# Patient Record
Sex: Male | Born: 1990 | Race: White | Hispanic: No | Marital: Married | Smoking: Former smoker
Health system: Southern US, Community
[De-identification: ages and names within clinical notes are randomized; demographics above are authoritative.]

## PROBLEM LIST (undated history)

## (undated) DIAGNOSIS — Z8619 Personal history of other infectious and parasitic diseases: Secondary | ICD-10-CM

## (undated) DIAGNOSIS — T7840XA Allergy, unspecified, initial encounter: Secondary | ICD-10-CM

## (undated) DIAGNOSIS — L309 Dermatitis, unspecified: Secondary | ICD-10-CM

## (undated) DIAGNOSIS — F329 Major depressive disorder, single episode, unspecified: Secondary | ICD-10-CM

## (undated) DIAGNOSIS — F32A Depression, unspecified: Secondary | ICD-10-CM

## (undated) DIAGNOSIS — F1910 Other psychoactive substance abuse, uncomplicated: Secondary | ICD-10-CM

## (undated) DIAGNOSIS — F988 Other specified behavioral and emotional disorders with onset usually occurring in childhood and adolescence: Secondary | ICD-10-CM

## (undated) DIAGNOSIS — G43909 Migraine, unspecified, not intractable, without status migrainosus: Secondary | ICD-10-CM

## (undated) HISTORY — DX: Major depressive disorder, single episode, unspecified: F32.9

## (undated) HISTORY — DX: Other psychoactive substance abuse, uncomplicated: F19.10

## (undated) HISTORY — DX: Other specified behavioral and emotional disorders with onset usually occurring in childhood and adolescence: F98.8

## (undated) HISTORY — PX: HERNIA REPAIR: SHX51

## (undated) HISTORY — DX: Personal history of other infectious and parasitic diseases: Z86.19

## (undated) HISTORY — DX: Depression, unspecified: F32.A

## (undated) HISTORY — DX: Allergy, unspecified, initial encounter: T78.40XA

## (undated) HISTORY — DX: Migraine, unspecified, not intractable, without status migrainosus: G43.909

## (undated) HISTORY — PX: TONSILLECTOMY AND ADENOIDECTOMY: SUR1326

## (undated) HISTORY — DX: Dermatitis, unspecified: L30.9

---
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
## 2010-09-16 ENCOUNTER — Ambulatory Visit: Payer: Self-pay | Admitting: Family Medicine

## 2010-09-16 DIAGNOSIS — G43909 Migraine, unspecified, not intractable, without status migrainosus: Secondary | ICD-10-CM | POA: Insufficient documentation

## 2010-09-16 DIAGNOSIS — F329 Major depressive disorder, single episode, unspecified: Secondary | ICD-10-CM

## 2010-09-24 ENCOUNTER — Encounter: Payer: Self-pay | Admitting: Family Medicine

## 2010-09-24 LAB — CONVERTED CEMR LAB: Osmolality, Ur: 490 mOsm/kg (ref 390–1090)

## 2010-09-27 LAB — CONVERTED CEMR LAB
BUN: 12 mg/dL (ref 6–23)
Basophils Absolute: 0 10*3/uL (ref 0.0–0.1)
Basophils Relative: 0.4 % (ref 0.0–3.0)
CO2: 25 meq/L (ref 19–32)
Calcium: 9.2 mg/dL (ref 8.4–10.5)
Chloride: 106 meq/L (ref 96–112)
Creatinine, Ser: 1.1 mg/dL (ref 0.4–1.5)
Eosinophils Absolute: 0.1 10*3/uL (ref 0.0–0.7)
Eosinophils Relative: 0.4 % (ref 0.0–5.0)
GFR calc non Af Amer: 94.55 mL/min (ref >60)
Glucose, Bld: 93 mg/dL (ref 70–99)
HCT: 45.5 % (ref 39.0–52.0)
Hemoglobin: 15.7 g/dL (ref 13.0–17.0)
Lymphocytes Relative: 33 % (ref 12.0–46.0)
Lymphs Abs: 4.3 10*3/uL — ABNORMAL HIGH (ref 0.7–4.0)
MCHC: 34.4 g/dL (ref 30.0–36.0)
MCV: 90.6 fL (ref 78.0–100.0)
Monocytes Absolute: 1 10*3/uL (ref 0.1–1.0)
Monocytes Relative: 7.6 % (ref 3.0–12.0)
Neutro Abs: 7.7 10*3/uL (ref 1.4–7.7)
Neutrophils Relative %: 58.6 % (ref 43.0–77.0)
Platelets: 254 10*3/uL (ref 150.0–400.0)
Potassium: 3.5 meq/L (ref 3.5–5.1)
RBC: 5.02 M/uL (ref 4.22–5.81)
RDW: 12.5 % (ref 11.5–14.6)
Sodium: 141 meq/L (ref 135–145)
WBC: 13.1 10*3/uL — ABNORMAL HIGH (ref 4.5–10.5)

## 2010-10-07 ENCOUNTER — Ambulatory Visit: Payer: Self-pay | Admitting: Family Medicine

## 2010-10-07 DIAGNOSIS — F988 Other specified behavioral and emotional disorders with onset usually occurring in childhood and adolescence: Secondary | ICD-10-CM | POA: Insufficient documentation

## 2011-01-06 NOTE — Assessment & Plan Note (Signed)
Summary: NEW PT TO EST/CPX/CLE   Vital Signs:  Patient profile:   20 year old male Height:      73 inches Weight:      251.0 pounds BMI:     33.24 Temp:     99.0 degrees F oral Pulse rate:   72 / minute Pulse rhythm:   regular BP sitting:   120 / 82  (left arm) Cuff size:   large  Vitals Entered By: Benny Lennert CMA Duncan Dull) (September 16, 2010 1:57 PM)  History of Present Illness: Chief complaint new patient to be established   20 year old male:  Polyuria: constant urination, has always been really thirsty. drinks a lot. drinks three or four glasses of water at a meal.  He has also had a problem with enuresis off and on, mostly as a child, but will still have occaisionally. D/w pediatrician, had some counselling in the past. will get up around two or three times a night. Very infrequently, but not with alcohol.  Infrequent and seems to be random.   Preventive Screening-Counseling & Management  Alcohol-Tobacco     Alcohol drinks/day: <1     Alcohol Counseling: not indicated; use of alcohol is not excessive or problematic     Smoking Status: quit < 6 months     Smoking Cessation Counseling: yes     Smoke Cessation Stage: quit     Tobacco Counseling: to remain off tobacco products  Caffeine-Diet-Exercise     Diet Counseling: to improve diet; diet is suboptimal     Does Patient Exercise: yes     Exercise (avg: min/session): 30-60     Times/week: 4     Exercise Counseling: to improve exercise regimen  Hep-HIV-STD-Contraception     HIV Risk: no risk noted     STD Risk: no risk noted     Testicular SE Education/Counseling to perform regular STE      Sexual History:  currently monogamous.        Drug Use:  smokes some.    Allergies (verified): No Known Drug Allergies  Past History:  Past Medical History: Current Problems:  MIGRAINE HEADACHE (ICD-346.90) DEPRESSION (ICD-311)    Past Surgical History: Double hernia repair at age 36 (17)  tonsils/adenoids  63  Family History: Family History Diabetes 1st degree relative Family History High cholesterol Family History Hypertension Family History Lung cancer Family History of Prostate CA 1st degree relative <50 Family History of Stroke M 1st degree relative <50 Family History of Cardiovascular disorder  Social History: Consulting civil engineer Single Former smoker, quit 2011 some green Regular exercise-yes Smoking Status:  quit < 6 months Drug Use:  smokes some Does Patient Exercise:  yes HIV Risk:  no risk noted STD Risk:  no risk noted Sexual History:  currently monogamous  Review of Systems  General: Denies fever, chills, sweats, and anorexia. Eyes: Denies blurring. ENT: Denies earache, ear discharge, decreased hearing, nasal congestion, and sore throat. CV: Denies chest pains, dyspnea on exertion, palpitations, and syncope. Resp: Denies cough, cough with exercise, dyspnea at rest, excessive sputum, nighttime cough or wheeze, and wheezing GI: Denies nausea, vomiting, diarrhea, constipation, change in bowel habits, abdominal pain, melena, BRBPR  GU: CONCERNS OVER INCREASED URINATION AT NIGHT, DURING THE DAY, SOME BED WETTING MS: no back pain, joint pain, stiffness, and arthritis. Derm: No rash, itching, and dryness Neuro: No abnormal gait, frequent headaches, paresthesias, seizures, vertigo, and weakness Psych: No anxiety, behavioral problems, compulsive behavior, depression, hyperactivity, and inattentive. Endo: No  polydipsia, polyphagia, polyuria, and unusual weight change Heme: No bruising or LAD Allergy: No urticaria or hayfever    Physical Exam  General:  Well-developed,well-nourished,in no acute distress; alert,appropriate and cooperative throughout examination Head:  Normocephalic and atraumatic without obvious abnormalities. No apparent alopecia or balding. Eyes:  pupils equal, pupils round, and pupils reactive to light.   Ears:  External ear exam shows no significant lesions or  deformities.  Otoscopic examination reveals clear canals, tympanic membranes are intact bilaterally without bulging, retraction, inflammation or discharge. Hearing is grossly normal bilaterally. Nose:  External nasal examination shows no deformity or inflammation. Nasal mucosa are pink and moist without lesions or exudates. Mouth:  Oral mucosa and oropharynx without lesions or exudates.  Teeth in good repair. Neck:  No deformities, masses, or tenderness noted. Chest Wall:  No deformities, masses, tenderness or gynecomastia noted. Lungs:  Normal respiratory effort, chest expands symmetrically. Lungs are clear to auscultation, no crackles or wheezes. Heart:  Normal rate and regular rhythm. S1 and S2 normal without gallop, murmur, click, rub or other extra sounds. Abdomen:  Bowel sounds positive,abdomen soft and non-tender without masses, organomegaly or hernias noted. Genitalia:  Testes bilaterally descended without nodularity, tenderness or masses. No scrotal masses or lesions. No penis lesions or urethral discharge. Msk:  normal ROM.   Extremities:  No clubbing, cyanosis, edema, or deformity noted with normal full range of motion of all joints.   Neurologic:  alert & oriented X3 and gait normal.   Skin:  Intact without suspicious lesions or rashes Cervical Nodes:  No lymphadenopathy noted Psych:  Cognition and judgment appear intact. Alert and cooperative with normal attention span and concentration. No apparent delusions, illusions, hallucinations   Impression & Recommendations:  Problem # 1:  HEALTH MAINTENANCE EXAM (ICD-V70.0) The patient's preventative maintenance and recommended screening tests for an annual wellness exam were reviewed in full today. Brought up to date unless services declined.  Counselled on the importance of diet, exercise, and its role in overall health and mortality. The patient's FH and SH was reviewed, including their home life, tobacco status, and drug and alcohol  status.   doing great, check basic labs, update Tdap, flu, o/w vaccines up to date doing well in college  Problem # 2:  POLYURIA (ZOX-096.04) Assessment: New  The patient raises the question about polyuria and enuresis - longstanding. Asks about diabetes insipidous - found an article on this. We discuss this and diabetes mellitus, which would be higher in my differential.  Check blood sugar and a random urine osmolality. If this is abnormal, then checking 24 hour urine studies with urine osmolality would be more accurate and should be done.   Orders: Venipuncture (54098) TLB-BMP (Basic Metabolic Panel-BMET) (80048-METABOL) T- * Misc. Laboratory test [id number])  Problem # 3:  ENURESIS (ICD-307.6) Assessment: New  Other Orders: TLB-CBC Platelet - w/Differential (85025-CBCD) Admin 1st Vaccine (78295) Flu Vaccine 61yrs + (62130) Tdap => 32yrs IM (86578) Admin of Any Addtl Vaccine (46962)  Prior Medications (reviewed today): None Current Allergies (reviewed today): No known allergies                     Flu Vaccine Consent Questions     Do you have a history of severe allergic reactions to this vaccine? no    Any prior history of allergic reactions to egg and/or gelatin? no    Do you have a sensitivity to the preservative Thimersol? no    Do you have a past  history of Guillan-Barre Syndrome? no    Do you currently have an acute febrile illness? no    Have you ever had a severe reaction to latex? no    Vaccine information given and explained to patient? yes    Are you currently pregnant? no    Lot Number:[id number]   Exp Date:06/04/2011   Site Given  Left Deltoid IMbflu   Immunizations Administered:  Tetanus Vaccine:    Vaccine Type: Tdap    Site: right deltoid    Mfr: GlaxoSmithKline    Dose: 0.5 ml    Route: IM    Given by: Benny Lennert CMA (AAMA)    Exp. Date: 09/23/2012    Lot #: [id number]    VIS given: 10/22/08 version given September 16, 2010.   Appended Document: NEW PT TO EST/CPX/CLE

## 2011-01-06 NOTE — Letter (Signed)
Summary: Records from [facility] 2006 - 2010  Records from [facility] 2006 - 2010   Imported By: Maryln Gottron 09/24/2010 12:41:24  _____________________________________________________________________  External Attachment:    Type:   Image     Comment:   External Document

## 2011-01-06 NOTE — Miscellaneous (Signed)
Summary: [employer] [phone]  [facility] 5409 - 2008   Imported By: Maryln Gottron 09/24/2010 12:31:09  _____________________________________________________________________  External Attachment:    Type:   Image     Comment:   External Document

## 2011-01-06 NOTE — Miscellaneous (Signed)
Summary: Controlled Substance Agreement  Controlled Substance Agreement   Imported By: Lanelle Bal 10/15/2010 10:15:35  _____________________________________________________________________  External Attachment:    Type:   Image     Comment:   External Document

## 2011-01-06 NOTE — Assessment & Plan Note (Signed)
Summary: DISCUSS ADD MEDS / LFW   Vital Signs:  Patient profile:   20 year old male Height:      73 inches Weight:      252.0 pounds BMI:     33.37 Temp:     99.0 degrees F oral Pulse rate:   72 / minute Pulse rhythm:   regular  Vitals Entered By: Benny Lennert CMA Duncan Dull) (October 07, 2010 12:29 PM)  History of Present Illness: Chief complaint Discuss ADD medication  20 year old male:  Having a hard time in school  Spacing out some in class. Difficulty concentratigng and getting things done.  Very difficulty studying. Tyrying to break up into intervals.   Failing one class.  D in the other  Not going out to much Minimal ETOH, nothing else    Allergies (verified): No Known Drug Allergies  Past History:  Past medical, surgical, family and social histories (including risk factors) reviewed, and no changes noted (except as noted below).  Past Medical History: MIGRAINE HEADACHE (ICD-346.90) DEPRESSION (ICD-311) ADD    Past Surgical History: Reviewed history from 09/16/2010 and no changes required. Double hernia repair at age 29 (72)  tonsils/adenoids 24  Family History: Reviewed history from 09/16/2010 and no changes required. Family History Diabetes 1st degree relative Family History High cholesterol Family History Hypertension Family History Lung cancer Family History of Prostate CA 1st degree relative <50 Family History of Stroke M 1st degree relative <50 Family History of Cardiovascular disorder  Social History: Reviewed history from 09/16/2010 and no changes required. Student Single Former smoker, quit 2011 some green Regular exercise-yes  Review of Systems Psych:  Denies anxiety, depression, and panic attacks.  Physical Exam  General:  GEN: Well-developed,well-nourished,in no acute distress; alert,appropriate and cooperative throughout examination HEENT: Normocephalic and atraumatic without obvious abnormalities. No apparent alopecia  or balding. Ears, externally no deformities PULM: Breathing comfortably in no respiratory distress EXT: No clubbing, cyanosis, or edema PSYCH: Normally interactive. Cooperative during the interview. Pleasant. Friendly and conversant. Not anxious or depressed appearing. Normal, full affect.    Impression & Recommendations:  Problem # 1:  ADD (ICD-314.00) Assessment New longstanding ADD, [place] [facility] records reviewed. Last on Focalin, has also been on Daytrana a few years ago.   Restart last Focalin dose at 20 mg  Complete Medication List: 1)  Focalin Xr 20 Mg Xr24h-cap (Dexmethylphenidate hcl) .Marland Kitchen.. 1 by mouth by mouth each morning (failure multiple other add meds with proven effectiveness)  Patient Instructions: 1)  f/u 2 months to recheck Prescriptions: FOCALIN XR 20 MG XR24H-CAP (DEXMETHYLPHENIDATE HCL) 1 by mouth by mouth each morning (failure multiple other ADD meds with proven effectiveness)  #30 x 0   Entered and Authorized by:   Hannah Beat MD   Signed by:   Hannah Beat MD on 10/07/2010   Method used:   Print then Give to Patient   RxID:   [id number]    Orders Added: 1)  Est. Patient Level III [69629]    Prior Medications (reviewed today): None Current Allergies (reviewed today): No known allergies

## 2011-09-06 LAB — POCT I-STAT, CHEM 8
BUN: 6
Calcium, Ion: 1.13
Chloride: 97
Creatinine, Ser: 1.1
Glucose, Bld: 88
HCT: 44
Hemoglobin: 15
Potassium: 3.5
Sodium: 133 — ABNORMAL LOW
TCO2: 26

## 2011-10-05 ENCOUNTER — Encounter: Payer: Self-pay | Admitting: Family Medicine

## 2011-10-05 ENCOUNTER — Ambulatory Visit (INDEPENDENT_AMBULATORY_CARE_PROVIDER_SITE_OTHER): Payer: BC Managed Care – PPO | Admitting: Family Medicine

## 2011-10-05 DIAGNOSIS — J45909 Unspecified asthma, uncomplicated: Secondary | ICD-10-CM | POA: Insufficient documentation

## 2011-10-05 DIAGNOSIS — J209 Acute bronchitis, unspecified: Secondary | ICD-10-CM

## 2011-10-05 MED ORDER — AZITHROMYCIN 250 MG PO TABS: ORAL_TABLET | ORAL | Status: AC

## 2011-10-05 NOTE — Patient Instructions (Signed)
Take Zithro. Use Flovent twice a day for two weeks. Take Guaifenesin (400mg ), take 11/2 tabs by mouth AM and NOON. Get GUAIFENESIN by  going to [facility] or [facility] and getting MUCOUS RELIEF EXPECTORANT/CONGESTION. DO NOT GET MUCINEX (Timed Release Guaifenesin)  Drink lots of fluids. Call if sxs worsen.

## 2011-10-05 NOTE — Assessment & Plan Note (Signed)
See instructions

## 2011-10-05 NOTE — Progress Notes (Signed)
  Subjective:    Patient ID: Cesar Boyle, male    DOB: Nov 14, 1991, 20 y.o.   MRN: [id number]  HPI Pt of Dr Copland's here for suspected chest cold. He feels he is gradually developing a chest cold since the weather has changed..he feels his chest is tight. He has been using his father's albuterol. He has had asthma off and on in the past. He does react to grass and does noyt remember being tested for allergies.  He has chills, has not had fever and he has checked. He denies headache, no ear pain, he has some rhinitis for a few days, dark green at night and in AM, no ST, cough off and on, worst in the AM and PM  with tightness pretty much there all the time. He hears wheezing most of the time. Before all of this he was breathing fine and had no wheezing.  He has not been on Abs lately and no UC visits recently. He was seen at [facility] on [address] for cold a few months ago. He was given breathing trmt, given Abs poss Doxy, no prednisone. He did ok with that trmt.  Review of SystemsNoncontributory except as above.      Objective:   Physical Exam  Constitutional: He appears well-developed and well-nourished. No distress.  HENT:  Head: Normocephalic and atraumatic.  Right Ear: External ear normal.  Left Ear: External ear normal.  Nose: Nose normal.  Mouth/Throat: Oropharynx is clear and moist.  Eyes: Conjunctivae and EOM are normal. Pupils are equal, round, and reactive to light. Right eye exhibits no discharge. Left eye exhibits no discharge.  Neck: Normal range of motion. Neck supple.  Cardiovascular: Normal rate and regular rhythm.   Pulmonary/Chest: Effort normal. He has wheezes.       Ronchi throughout.  Lymphadenopathy:    He has no cervical adenopathy.  Skin: He is not diaphoretic.          Assessment & Plan:

## 2011-12-04 ENCOUNTER — Emergency Department (HOSPITAL_COMMUNITY): Payer: BC Managed Care – PPO

## 2011-12-04 ENCOUNTER — Encounter (HOSPITAL_COMMUNITY): Payer: Self-pay | Admitting: Emergency Medicine

## 2011-12-04 ENCOUNTER — Emergency Department (HOSPITAL_COMMUNITY)
Admission: EM | Admit: 2011-12-04 | Discharge: 2011-12-05 | Disposition: A | Discharge status: Home / Self Care | Payer: BC Managed Care – PPO | Attending: Emergency Medicine | Admitting: Emergency Medicine

## 2011-12-04 DIAGNOSIS — R059 Cough, unspecified: Secondary | ICD-10-CM | POA: Insufficient documentation

## 2011-12-04 DIAGNOSIS — R0602 Shortness of breath: Secondary | ICD-10-CM | POA: Insufficient documentation

## 2011-12-04 DIAGNOSIS — J45909 Unspecified asthma, uncomplicated: Secondary | ICD-10-CM | POA: Insufficient documentation

## 2011-12-04 DIAGNOSIS — R05 Cough: Secondary | ICD-10-CM | POA: Insufficient documentation

## 2011-12-04 DIAGNOSIS — R0989 Other specified symptoms and signs involving the circulatory and respiratory systems: Secondary | ICD-10-CM | POA: Insufficient documentation

## 2011-12-04 MED ORDER — IPRATROPIUM BROMIDE 0.02 % IN SOLN
0.5000 mg | Freq: Once | RESPIRATORY_TRACT | Status: AC
  Administered 2011-12-04: 0.5 mg via RESPIRATORY_TRACT
  Filled 2011-12-04: qty 2.5

## 2011-12-04 MED ORDER — PREDNISONE 20 MG PO TABS: 40.0000 mg | ORAL_TABLET | Freq: Every day | ORAL | Status: AC

## 2011-12-04 MED ORDER — ALBUTEROL SULFATE HFA 108 (90 BASE) MCG/ACT IN AERS
2.0000 | INHALATION_SPRAY | RESPIRATORY_TRACT | Status: DC | PRN
  Filled 2011-12-04: qty 6.7

## 2011-12-04 MED ORDER — PREDNISONE 20 MG PO TABS
60.0000 mg | ORAL_TABLET | Freq: Once | ORAL | Status: AC
  Administered 2011-12-04: 60 mg via ORAL
  Filled 2011-12-04: qty 3

## 2011-12-04 MED ORDER — AEROCHAMBER MAX W/MASK SMALL MISC: 1.0000 | Freq: Once | Status: DC

## 2011-12-04 MED ORDER — ALBUTEROL SULFATE (5 MG/ML) 0.5% IN NEBU
5.0000 mg | INHALATION_SOLUTION | Freq: Once | RESPIRATORY_TRACT | Status: AC
  Administered 2011-12-04: 5 mg via RESPIRATORY_TRACT
  Filled 2011-12-04: qty 0.5

## 2011-12-04 NOTE — ED Notes (Signed)
Called respiratory to administer nebulizer treatment; [place], RT on way.

## 2011-12-04 NOTE — ED Provider Notes (Signed)
History     CSN: [id number]  Arrival date & time 12/04/11  2033   First MD Initiated Contact with Patient 12/04/11 2226      Chief Complaint  Patient presents with  . Asthma    (Consider location/radiation/quality/duration/timing/severity/associated sxs/prior treatment) HPI Comments: Patients with history of asthma, requiring occasional inhaler use. States that he has had chest congestion and shortness of breath for one week. He has been using his inhaler daily as well as decongestant medication. Patient states worsening shortness of breath tonight. He states shortness of breath has been worse tonight than it has been in the past. He has run out of his inhaler. Patient denies fever, nausea, vomiting. He denies ear pain, runny nose, sore throat.  Patient is a 20 y.o. male presenting with asthma. The history is provided by the patient.  Asthma This is a recurrent problem. The current episode started in the past 7 days. The problem occurs daily. Associated symptoms include coughing. Pertinent negatives include no abdominal pain, chest pain, chills, fever, headaches, myalgias, nausea, rash, sore throat or vomiting. The symptoms are aggravated by nothing. Treatments tried: Albuterol. The treatment provided mild relief.  Asthma This is a recurrent problem. The current episode started in the past 7 days. The problem occurs daily. Associated symptoms include shortness of breath. Pertinent negatives include no chest pain, no abdominal pain and no headaches. The symptoms are aggravated by nothing. Treatments tried: Albuterol. The treatment provided mild relief.    Past Medical History  Diagnosis Date  . Migraine headache   . Depression   . ADD (attention deficit disorder)   . Asthma     Past Surgical History  Procedure Date  . Hernia repair age 71 11    double  . Tonsillectomy and adenoidectomy     Family History  Problem Relation Age of Onset  . Diabetes Other     1st degree  relative  . Hyperlipidemia Other   . Cancer Other     lung, prostate  . Stroke Other   . Heart disease Other     History  Substance Use Topics  . Smoking status: Former Games developer  . Smokeless tobacco: Former Neurosurgeon   Comment: quit 2011  . Alcohol Use: Yes     occassionally      Review of Systems  Constitutional: Negative for fever and chills.  HENT: Negative for sore throat and rhinorrhea.   Eyes: Negative for redness.  Respiratory: Positive for cough, shortness of breath and wheezing.   Cardiovascular: Negative for chest pain.  Gastrointestinal: Negative for nausea, vomiting, abdominal pain, diarrhea and constipation.  Musculoskeletal: Negative for myalgias.  Skin: Negative for rash.  Neurological: Negative for dizziness and headaches.    Allergies  Ceclor  Home Medications   Current Outpatient Rx  Name Route Sig Dispense Refill  . ALBUTEROL SULFATE HFA 108 (90 BASE) MCG/ACT IN AERS Inhalation Inhale 2 puffs into the lungs every 6 (six) hours as needed. For shortness of breath     . MUCINEX D PO Oral Take 1 tablet by mouth every 12 (twelve) hours as needed. For congestion      BP 125/86  Pulse 65  Temp(Src) 98 F (36.7 C) (Oral)  Resp 20  SpO2 96%  Physical Exam  Nursing note and vitals reviewed. Constitutional: He is oriented to person, place, and time. He appears well-developed and well-nourished.  HENT:  Head: Normocephalic and atraumatic.  Right Ear: Tympanic membrane and external ear normal.  Left Ear: Tympanic  membrane and external ear normal.  Nose: Nose normal.  Mouth/Throat: Uvula is midline, oropharynx is clear and moist and mucous membranes are normal.  Eyes: Conjunctivae are normal. Pupils are equal, round, and reactive to light. Right eye exhibits no discharge. Left eye exhibits no discharge.  Neck: Normal range of motion. Neck supple.  Cardiovascular: Normal rate, regular rhythm and normal heart sounds.   Pulmonary/Chest: Effort normal. He has  wheezes.       Diffuse expiratory wheeze in all fields. Wheezing is mild.  Abdominal: Soft. Bowel sounds are normal. There is no tenderness. There is no rebound and no guarding.  Musculoskeletal: He exhibits no edema.  Lymphadenopathy:    He has no cervical adenopathy.  Neurological: He is alert and oriented to person, place, and time.  Skin: Skin is warm and dry.  Psychiatric: He has a normal mood and affect.    ED Course  Procedures (including critical care time)  Labs Reviewed - No data to display No results found.   1. Asthma     11:22 PM patient appears comfortable. He has wheezing. Albuterol treatment ordered. Steroids ordered. Chest x-ray ordered given reported severity of symptoms. Will reevaluate.  Patient was much improved after albuterol treatment. Chest x-ray was negative. Patient informed. Patient is requesting discharge to home. Will discharge to home with albuterol inhaler as well as oral prednisone. Patient verbalizes understanding and agrees with plan. Patient urged to return with worsening shortness of breath. Urged primary care physician followup.  MDM  Patient with asthma exacerbation, negative chest x-ray. Clinically improved in emergency department with albuterol. Discharge home on prednisone.     Medical screening examination/treatment/procedure(s) were performed by non-physician practitioner and as supervising physician I was immediately available for consultation/collaboration. Osvaldo Human, M.D.    Eustace Moore [place], [place] 12/05/11 4098  Carleene Cooper III, MD 12/05/11 [phone]

## 2011-12-04 NOTE — ED Notes (Signed)
Pt states understanding of discharge instructions 

## 2011-12-04 NOTE — ED Notes (Signed)
Patient complaining of shortness of breath, difficulty of breathing, nasal congestion, non-productive cough, and chest congestion over the past few weeks.  Patient states that symptoms have worsened since yesterday.  Patient feels that his airways are tight, especially since he has not used his inhaler yesterday or today.  Patient ran out of his inhaler prescription (albuterol) yesterday, and states that this is probably why his symptoms have worsened.  Patient does not take nebulizer treatments at home.  Patient has history of asthma and bronchitis.  Patient denies chest pain, blurred vision, numbness/tingling in hands/feet, and headache.  Upon ascultation, expiratory wheezing heard bilaterally.  Patient alert and oriented x4; PERRL present.  Will continue to monitor.

## 2011-12-04 NOTE — ED Notes (Signed)
C/o sob x 1 week.  Ran out of inhalers yesterday.  Cold symptoms for 1 week.  Taking OTC meds without relief.

## 2011-12-04 NOTE — ED Notes (Signed)
Gave report to Chris, RN.

## 2011-12-10 ENCOUNTER — Ambulatory Visit: Payer: BC Managed Care – PPO

## 2012-02-10 ENCOUNTER — Encounter (HOSPITAL_COMMUNITY): Payer: Self-pay | Admitting: Emergency Medicine

## 2012-02-10 ENCOUNTER — Emergency Department (HOSPITAL_COMMUNITY)
Admission: EM | Admit: 2012-02-10 | Discharge: 2012-02-11 | Disposition: A | Discharge status: Home / Self Care | Payer: BC Managed Care – PPO | Attending: Emergency Medicine | Admitting: Emergency Medicine

## 2012-02-10 DIAGNOSIS — J45901 Unspecified asthma with (acute) exacerbation: Secondary | ICD-10-CM

## 2012-02-10 DIAGNOSIS — R05 Cough: Secondary | ICD-10-CM | POA: Insufficient documentation

## 2012-02-10 DIAGNOSIS — R0602 Shortness of breath: Secondary | ICD-10-CM | POA: Insufficient documentation

## 2012-02-10 DIAGNOSIS — R059 Cough, unspecified: Secondary | ICD-10-CM | POA: Insufficient documentation

## 2012-02-10 DIAGNOSIS — J45909 Unspecified asthma, uncomplicated: Secondary | ICD-10-CM | POA: Insufficient documentation

## 2012-02-10 MED ORDER — ALBUTEROL SULFATE HFA 108 (90 BASE) MCG/ACT IN AERS
2.0000 | INHALATION_SPRAY | RESPIRATORY_TRACT | Status: DC | PRN
  Administered 2012-02-11: 2 via RESPIRATORY_TRACT
  Filled 2012-02-10: qty 6.7

## 2012-02-10 MED ORDER — IPRATROPIUM BROMIDE 0.02 % IN SOLN
0.5000 mg | Freq: Once | RESPIRATORY_TRACT | Status: AC
Start: 2012-02-10 — End: 2012-02-10
  Administered 2012-02-10: 0.5 mg via RESPIRATORY_TRACT
  Filled 2012-02-10: qty 2.5

## 2012-02-10 MED ORDER — ALBUTEROL SULFATE (5 MG/ML) 0.5% IN NEBU
5.0000 mg | INHALATION_SOLUTION | Freq: Once | RESPIRATORY_TRACT | Status: AC
Start: 2012-02-10 — End: 2012-02-10
  Administered 2012-02-10: 5 mg via RESPIRATORY_TRACT
  Filled 2012-02-10: qty 1

## 2012-02-10 MED ORDER — PREDNISONE 20 MG PO TABS
60.0000 mg | ORAL_TABLET | Freq: Once | ORAL | Status: AC
  Administered 2012-02-10: 60 mg via ORAL
  Filled 2012-02-10: qty 3

## 2012-02-10 NOTE — ED Provider Notes (Signed)
History     CSN: [id number]  Arrival date & time 02/10/12  2136   First MD Initiated Contact with Patient 02/10/12 2155      No chief complaint on file.   (Consider location/radiation/quality/duration/timing/severity/associated sxs/prior treatment) Patient is a 21 y.o. male presenting with wheezing. The history is provided by the patient. No language interpreter was used.  Wheezing  The current episode started 5 to 7 days ago. The onset was gradual. The problem has been gradually worsening. The problem is moderate. The symptoms are relieved by beta-agonist inhalers. Associated symptoms include cough, shortness of breath and wheezing. Pertinent negatives include no chest pain, no chest pressure, no orthopnea, no fever, no rhinorrhea, no sore throat and no stridor. His past medical history is significant for asthma and past wheezing. His past medical history does not include bronchiolitis. He has been behaving normally.   Reports 2 weeks of increased wheezing and cough. Denies fever or shortness of breath. States that it has awakened in the night here recently. States that he has used his inhaler more than 5 times a day. She quit smoking 2 years ago. Past medical history of migraines depression ADD and asthma. He goes to the clinic on [address].   Past Medical History  Diagnosis Date  . Migraine headache   . Depression   . ADD (attention deficit disorder)   . Asthma     Past Surgical History  Procedure Date  . Hernia repair age [age]    double  . Tonsillectomy and adenoidectomy     Family History  Problem Relation Age of Onset  . Diabetes Other     1st degree relative  . Hyperlipidemia Other   . Cancer Other     lung, prostate  . Stroke Other   . Heart disease Other     History  Substance Use Topics  . Smoking status: Former Games developer  . Smokeless tobacco: Former Neurosurgeon   Comment: quit 2011  . Alcohol Use: Yes     occassionally      Review of Systems    Constitutional: Negative for fever.  HENT: Positive for congestion. Negative for sore throat and rhinorrhea.   Respiratory: Positive for cough, shortness of breath and wheezing. Negative for stridor.   Cardiovascular: Negative for chest pain, orthopnea and leg swelling.  Gastrointestinal: Negative.   Neurological: Negative for dizziness, light-headedness and headaches.  Psychiatric/Behavioral: Negative.   All other systems reviewed and are negative.    Allergies  Ceclor  Home Medications   Current Outpatient Rx  Name Route Sig Dispense Refill  . ALBUTEROL SULFATE HFA 108 (90 BASE) MCG/ACT IN AERS Inhalation Inhale 2 puffs into the lungs every 6 (six) hours as needed. For shortness of breath     . ASPIRIN-ACETAMINOPHEN-CAFFEINE 250-250-65 MG PO TABS Oral Take 1 tablet by mouth every 6 (six) hours as needed. For pain relief    . ADULT MULTIVITAMIN W/MINERALS CH Oral Take 1 tablet by mouth daily.    Marland Kitchen MUCINEX D PO Oral Take 1 tablet by mouth every 12 (twelve) hours as needed. For congestion      There were no vitals taken for this visit.  Physical Exam  Nursing note and vitals reviewed. Constitutional: He is oriented to person, place, and time. He appears well-developed and well-nourished.  HENT:  Head: Normocephalic.  Eyes: Conjunctivae are normal. Pupils are equal, round, and reactive to light.  Neck: Neck supple.  Cardiovascular: Normal rate and regular rhythm.   Pulmonary/Chest:  Breath sounds normal. No respiratory distress.  Abdominal: Soft.  Musculoskeletal: Normal range of motion.  Neurological: He is alert and oriented to person, place, and time. No cranial nerve deficit.  Skin: Skin is warm and dry.  Psychiatric: He has a normal mood and affect.    ED Course  Procedures (including critical care time)  Labs Reviewed - No data to display No results found.   No diagnosis found.    MDM  Asthma attack out of inhaler.   Wheezing inspiratory x 2 weeks with  cough.  No fever.  Better after aerosol tmt and prednisone in the ER.  Will get a PCP to follow up with or return to er if worse.  Sent home with new inhaler and prednisone.           Jethro Bastos, NP 02/11/12 1102

## 2012-02-10 NOTE — ED Notes (Signed)
Pt wheezing on arrival, out of inhaler

## 2012-02-10 NOTE — ED Notes (Signed)
NP at bedside.

## 2012-02-11 MED ORDER — AZITHROMYCIN 250 MG PO TABS: ORAL_TABLET | ORAL | Status: AC

## 2012-02-11 MED ORDER — PREDNISONE 10 MG PO TABS: 40.0000 mg | ORAL_TABLET | Freq: Every day | ORAL | Status: DC

## 2012-02-11 NOTE — Discharge Instructions (Signed)
We gave you a breathing  Treatment in the ER. We also gave you a prednisone 60mg . Start the prednisone rx tomorrow.  Take the antibiotics if you get a fever or your cough gets worse.  Return for severe SOB or other concerns.Asthma, Adult Asthma is a disease of the lungs and can make it hard to breathe. Asthma cannot be cured, but medicine can help control it. Asthma may be started (triggered) by:  Pollen.   Dust.   Animal skin flakes (dander).   Molds.   Foods.   Respiratory infections (colds, flu).   Smoke.   Exercise.   Stress.   Other things that cause allergic reactions or allergies (allergens).  HOME CARE   Talk to your doctor about how to manage your attacks at home. This may include:   Using a tool called a peak flow meter.   Having medicine ready to stop the attack.   Take all medicine as told by your doctor.   Wash bed sheets and blankets every week in hot water and put them in the dryer.   Drink enough fluids to keep your pee (urine) clear or pale yellow.   Always be ready to get emergency help. Write down the phone number for your doctor. Keep it where you can easily find it.   Talk about exercise routines with your doctor.   If animal dander is causing your asthma, you may need to find a new home for your pet(s).  GET HELP RIGHT AWAY IF:   You have muscle aches.   You cough more.   You have chest pain.   You have thick spit (sputum) that changes to yellow, green, gray, or bloody.   Medicine does not stop your wheezing.   You have problems breathing.   You have a fever.   Your medicine causes:   A rash.   Itching.   Puffiness (swelling).   Breathing problems.  MAKE SURE YOU:   Understand these instructions.   Will watch your condition.   Will get help right away if you are not doing well or get worse.  Document Released: 05/09/2008 Document Revised: 11/10/2011 Document Reviewed: 10/01/2008 [facility] Patient Information 2012  [place], [place].Asthma Attack Prevention HOW CAN ASTHMA BE PREVENTED? Currently, there is no way to prevent asthma from starting. However, you can take steps to control the disease and prevent its symptoms after you have been diagnosed. Learn about your asthma and how to control it. Take an active role to control your asthma by working with your caregiver to create and follow an asthma action plan. An asthma action plan guides you in taking your medicines properly, avoiding factors that make your asthma worse, tracking your level of asthma control, responding to worsening asthma, and seeking emergency care when needed. To track your asthma, keep records of your symptoms, check your peak flow number using a peak flow meter (handheld device that shows how well air moves out of your lungs), and get regular asthma checkups.  Other ways to prevent asthma attacks include:  Use medicines as your caregiver directs.   Identify and avoid things that make your asthma worse (as much as you can).   Keep track of your asthma symptoms and level of control.   Get regular checkups for your asthma.   With your caregiver, write a detailed plan for taking medicines and managing an asthma attack. Then be sure to follow your action plan. Asthma is an ongoing condition that needs regular monitoring and  treatment.   Identify and avoid asthma triggers. A number of outdoor allergens and irritants (pollen, mold, cold air, air pollution) can trigger asthma attacks. Find out what causes or makes your asthma worse, and take steps to avoid those triggers (see below).   Monitor your breathing. Learn to recognize warning signs of an attack, such as slight coughing, wheezing or shortness of breath. However, your lung function may already decrease before you notice any signs or symptoms, so regularly measure and record your peak airflow with a home peak flow meter.   Identify and treat attacks early. If you act quickly, you're less  likely to have a severe attack. You will also need less medicine to control your symptoms. When your peak flow measurements decrease and alert you to an upcoming attack, take your medicine as instructed, and immediately stop any activity that may have triggered the attack. If your symptoms do not improve, get medical help.   Pay attention to increasing quick-relief inhaler use. If you find yourself relying on your quick-relief inhaler (such as albuterol), your asthma is not under control. See your caregiver about adjusting your treatment.  IDENTIFY AND CONTROL FACTORS THAT MAKE YOUR ASTHMA WORSE A number of common things can set off or make your asthma symptoms worse (asthma triggers). Keep track of your asthma symptoms for several weeks, detailing all the environmental and emotional factors that are linked with your asthma. When you have an asthma attack, go back to your asthma diary to see which factor, or combination of factors, might have contributed to it. Once you know what these factors are, you can take steps to control many of them.  Allergies: If you have allergies and asthma, it is important to take asthma prevention steps at home. Asthma attacks (worsening of asthma symptoms) can be triggered by allergies, which can cause temporary increased inflammation of your airways. Minimizing contact with the substance to which you are allergic will help prevent an asthma attack. Animal Dander:   Some people are allergic to the flakes of skin or dried saliva from animals with fur or feathers. Keep these pets out of your home.   If you can't keep a pet outdoors, keep the pet out of your bedroom and other sleeping areas at all times, and keep the door closed.   Remove carpets and furniture covered with cloth from your home. If that is not possible, keep the pet away from fabric-covered furniture and carpets.  Dust Mites:  Many people with asthma are allergic to dust mites. Dust mites are tiny bugs that  are found in every home, in mattresses, pillows, carpets, fabric-covered furniture, bedcovers, clothes, stuffed toys, fabric, and other fabric-covered items.   Cover your mattress in a special dust-proof cover.   Cover your pillow in a special dust-proof cover, or wash the pillow each week in hot water. Water must be hotter than 130 F to kill dust mites. Cold or warm water used with detergent and bleach can also be effective.   Wash the sheets and blankets on your bed each week in hot water.   Try not to sleep or lie on cloth-covered cushions.   Call ahead when traveling and ask for a smoke-free hotel room. Bring your own bedding and pillows, in case the hotel only supplies feather pillows and down comforters, which may contain dust mites and cause asthma symptoms.   Remove carpets from your bedroom and those laid on concrete, if you can.   Keep stuffed toys out  of the bed, or wash the toys weekly in hot water or cooler water with detergent and bleach.  Cockroaches:  Many people with asthma are allergic to the droppings and remains of cockroaches.   Keep food and garbage in closed containers. Never leave food out.   Use poison baits, traps, powders, gels, or paste (for example, boric acid).   If a spray is used to kill cockroaches, stay out of the room until the odor goes away.  Indoor Mold:  Fix leaky faucets, pipes, or other sources of water that have mold around them.   Clean moldy surfaces with a cleaner that has bleach in it.  Pollen and Outdoor Mold:  When pollen or mold spore counts are high, try to keep your windows closed.   Stay indoors with windows closed from late morning to afternoon, if you can. Pollen and some mold spore counts are highest at that time.   Ask your caregiver whether you need to take or increase anti-inflammatory medicine before your allergy season starts.  Irritants:   Tobacco smoke is an irritant. If you smoke, ask your caregiver how you can  quit. Ask family members to quit smoking, too. Do not allow smoking in your home or car.   If possible, do not use a wood-burning stove, kerosene heater, or fireplace. Minimize exposure to all sources of smoke, including incense, candles, fires, and fireworks.   Try to stay away from strong odors and sprays, such as perfume, talcum powder, hair spray, and paints.   Decrease humidity in your home and use an indoor air cleaning device. Reduce indoor humidity to below 60 percent. Dehumidifiers or central air conditioners can do this.   Try to have someone else vacuum for you once or twice a week, if you can. Stay out of rooms while they are being vacuumed and for a short while afterward.   If you vacuum, use a dust mask from a hardware store, a double-layered or microfilter vacuum cleaner bag, or a vacuum cleaner with a HEPA filter.   Sulfites in foods and beverages can be irritants. Do not drink beer or wine, or eat dried fruit, processed potatoes, or shrimp if they cause asthma symptoms.   Cold air can trigger an asthma attack. Cover your nose and mouth with a scarf on cold or windy days.   Several health conditions can make asthma more difficult to manage, including runny nose, sinus infections, reflux disease, psychological stress, and sleep apnea. Your caregiver will treat these conditions, as well.   Avoid close contact with people who have a cold or the flu, since your asthma symptoms may get worse if you catch the infection from them. Wash your hands thoroughly after touching items that may have been handled by people with a respiratory infection.   Get a flu shot every year to protect against the flu virus, which often makes asthma worse for days or weeks. Also get a pneumonia shot once every five to 10 years.  Drugs:  Aspirin and other painkillers can cause asthma attacks. 10% to 20% of people with asthma have sensitivity to aspirin or a group of painkillers called non-steroidal  anti-inflammatory drugs (NSAIDS), such as ibuprofen and naproxen. These drugs are used to treat pain and reduce fevers. Asthma attacks caused by any of these medicines can be severe and even fatal. These drugs must be avoided in people who have known aspirin sensitive asthma. Products with acetaminophen are considered safe for people who have asthma. It  is important that people with aspirin sensitivity read labels of all over-the-counter drugs used to treat pain, colds, coughs, and fever.   Beta blockers and ACE inhibitors are other drugs which you should discuss with your caregiver, in relation to your asthma.  ALLERGY SKIN TESTING  Ask your asthma caregiver about allergy skin testing or blood testing (RAST test) to identify the allergens to which you are sensitive. If you are found to have allergies, allergy shots (immunotherapy) for asthma may help prevent future allergies and asthma. With allergy shots, small doses of allergens (substances to which you are allergic) are injected under your skin on a regular schedule. Over a period of time, your body may become used to the allergen and less responsive with asthma symptoms. You can also take measures to minimize your exposure to those allergens. EXERCISE  If you have exercise-induced asthma, or are planning vigorous exercise, or exercise in cold, humid, or dry environments, prevent exercise-induced asthma by following your caregiver's advice regarding asthma treatment before exercising. Document Released: 11/09/2009 Document Revised: 11/10/2011 Document Reviewed: 11/09/2009 [facility] Patient Information 2012 [place], [place].

## 2012-02-11 NOTE — ED Provider Notes (Signed)
Medical screening examination/treatment/procedure(s) were performed by non-physician practitioner and as supervising physician I was immediately available for consultation/collaboration.   Forbes Cellar, MD 02/11/12 1553

## 2012-02-11 NOTE — ED Notes (Signed)
Pt now "feeliing better" "breathing a lot better"

## 2012-05-10 ENCOUNTER — Emergency Department: Payer: Self-pay | Admitting: *Deleted

## 2012-10-22 ENCOUNTER — Ambulatory Visit: Payer: BC Managed Care – PPO | Admitting: Family Medicine

## 2012-10-22 VITALS — BP 145/95 | HR 80 | Temp 97.7°F | Resp 18 | Wt 203.0 lb

## 2012-10-22 DIAGNOSIS — J45909 Unspecified asthma, uncomplicated: Secondary | ICD-10-CM

## 2012-10-22 DIAGNOSIS — J329 Chronic sinusitis, unspecified: Secondary | ICD-10-CM

## 2012-10-22 MED ORDER — MOMETASONE FUROATE 50 MCG/ACT NA SUSP: 2.0000 | Freq: Every day | NASAL | Status: DC

## 2012-10-22 MED ORDER — AZITHROMYCIN 250 MG PO TABS: ORAL_TABLET | ORAL | Status: DC

## 2012-10-22 MED ORDER — ALBUTEROL SULFATE HFA 108 (90 BASE) MCG/ACT IN AERS: 2.0000 | INHALATION_SPRAY | Freq: Four times a day (QID) | RESPIRATORY_TRACT | Status: DC | PRN

## 2012-10-22 NOTE — Progress Notes (Signed)
@[email or web address] @   Patient ID: Cesar Boyle MRN: [id number], DOB: 25-Mar-1991, 21 y.o. Date of Encounter: 10/22/2012, 6:53 PM  Primary Physician: Hannah Beat, MD  Chief Complaint:  Chief Complaint  Patient presents with  . URI    needs refill on inhaler    HPI: 21 y.o. year old male presents with a 7 day history of nasal congestion, post nasal drip, sore throat, and cough. Mild sinus pressure. Afebrile. No chills. Nasal congestion thick and green/yellow. Cough is productive of green/yellow sputum and not associated with time of day. Ears feel full, leading to sensation of muffled hearing. Has tried OTC cold preps without success. No GI complaints.   No sick contacts, recent antibiotics, or recent travels.   No leg trauma, sedentary periods, h/o cancer, or tobacco use.  Past Medical History  Diagnosis Date  . Migraine headache   . Depression   . ADD (attention deficit disorder)   . Asthma   . Allergy   . Substance abuse      Home Meds: Prior to Admission medications   Medication Sig Start Date End Date Taking? Authorizing Provider  albuterol (PROVENTIL HFA;VENTOLIN HFA) 108 (90 BASE) MCG/ACT inhaler Inhale 2 puffs into the lungs every 6 (six) hours as needed. For shortness of breath    Yes Historical Provider, MD  methadone (DOLOPHINE) 10 MG tablet Take 50 mg by mouth every 8 (eight) hours.   Yes Historical Provider, MD    Allergies:  Allergies  Allergen Reactions  . Ceclor (Cefaclor) Rash    As a child    History   Social History  . Marital Status: Single    Spouse Name: N/A    Number of Children: N/A  . Years of Education: N/A   Occupational History  . Student    Social History Main Topics  . Smoking status: Former Games developer  . Smokeless tobacco: Former Neurosurgeon     Comment: quit 2011  . Alcohol Use: Yes     Comment: occassionally  . Drug Use: No  . Sexually Active: Not on file   Other Topics Concern  . Not on file   Social History Narrative   SingleRegular exercise-yesSome green     Review of Systems: Constitutional: negative for chills, fever, night sweats or weight changes Cardiovascular: negative for chest pain or palpitations Respiratory: negative for hemoptysis, wheezing, or shortness of breath Abdominal: negative for abdominal pain, nausea, vomiting or diarrhea Dermatological: negative for rash Neurologic: negative for headache   Physical Exam: Blood pressure 145/95, pulse 80, temperature 97.7 F (36.5 C), temperature source Oral, resp. rate 18, weight 203 lb (92.08 kg), SpO2 100.00%., There is no height on file to calculate BMI. General: Well developed, well nourished, in no acute distress. Head: Normocephalic, atraumatic, eyes without discharge, sclera non-icteric, nares are congested. Bilateral auditory canals clear, TM's are without perforation, pearly grey with reflective cone of light bilaterally. No sinus TTP. Oral cavity moist, dentition normal. Posterior pharynx with post nasal drip and mild erythema. No peritonsillar abscess or tonsillar exudate. Neck: Supple. No thyromegaly. Full ROM. No lymphadenopathy. Lungs: Coarse breath sounds bilaterally with wheezes, No rales, or rhonchi. Breathing is a little labored.  Heart: RRR with S1 S2. No murmurs, rubs, or gallops appreciated. Msk:  Strength and tone normal for age. Extremities: No clubbing or cyanosis. No edema. Neuro: Alert and oriented X 3. Moves all extremities spontaneously. CNII-XII grossly in tact. Psych:  Responds to questions appropriately with a normal affect.     ASSESSMENT  AND PLAN:  21 y.o. year old male with bronchitis and asthma 1. Asthma  albuterol (PROVENTIL HFA;VENTOLIN HFA) 108 (90 BASE) MCG/ACT inhaler, mometasone (NASONEX) 50 MCG/ACT nasal spray, azithromycin (ZITHROMAX Z-PAK) 250 MG tablet  2. Sinusitis  mometasone (NASONEX) 50 MCG/ACT nasal spray, azithromycin (ZITHROMAX Z-PAK) 250 MG tablet     - -Mucinex -Tylenol/Motrin  prn -Rest/fluids -RTC precautions -RTC 3-5 days if no improvement  Signed, Elvina Sidle, MD 10/22/2012 6:53 PM

## 2012-10-22 NOTE — Patient Instructions (Signed)

## 2013-12-25 ENCOUNTER — Encounter (HOSPITAL_COMMUNITY): Payer: Self-pay | Admitting: Emergency Medicine

## 2013-12-25 ENCOUNTER — Emergency Department (HOSPITAL_COMMUNITY)
Admission: EM | Admit: 2013-12-25 | Discharge: 2013-12-26 | Disposition: A | Discharge status: Home / Self Care | Payer: BC Managed Care – PPO | Attending: Emergency Medicine | Admitting: Emergency Medicine

## 2013-12-25 DIAGNOSIS — Z87891 Personal history of nicotine dependence: Secondary | ICD-10-CM | POA: Insufficient documentation

## 2013-12-25 DIAGNOSIS — J45909 Unspecified asthma, uncomplicated: Secondary | ICD-10-CM | POA: Insufficient documentation

## 2013-12-25 DIAGNOSIS — Y9289 Other specified places as the place of occurrence of the external cause: Secondary | ICD-10-CM | POA: Insufficient documentation

## 2013-12-25 DIAGNOSIS — Z8679 Personal history of other diseases of the circulatory system: Secondary | ICD-10-CM | POA: Insufficient documentation

## 2013-12-25 DIAGNOSIS — T401X4A Poisoning by heroin, undetermined, initial encounter: Secondary | ICD-10-CM | POA: Insufficient documentation

## 2013-12-25 DIAGNOSIS — Z79899 Other long term (current) drug therapy: Secondary | ICD-10-CM | POA: Insufficient documentation

## 2013-12-25 DIAGNOSIS — Y9389 Activity, other specified: Secondary | ICD-10-CM | POA: Insufficient documentation

## 2013-12-25 DIAGNOSIS — Z8659 Personal history of other mental and behavioral disorders: Secondary | ICD-10-CM | POA: Insufficient documentation

## 2013-12-25 DIAGNOSIS — T401X1A Poisoning by heroin, accidental (unintentional), initial encounter: Secondary | ICD-10-CM | POA: Insufficient documentation

## 2013-12-25 DIAGNOSIS — R55 Syncope and collapse: Secondary | ICD-10-CM | POA: Insufficient documentation

## 2013-12-25 MED ORDER — ONDANSETRON HCL 4 MG/2ML IJ SOLN
4.0000 mg | Freq: Once | INTRAMUSCULAR | Status: AC
  Administered 2013-12-25: 4 mg via INTRAVENOUS
  Filled 2013-12-25: qty 2

## 2013-12-25 NOTE — ED Notes (Signed)
MD at bedside. 

## 2013-12-25 NOTE — ED Provider Notes (Signed)
CSN: [id number]     Arrival date & time 12/25/13  2058 History   First MD Initiated Contact with Patient 12/25/13 2123     Chief Complaint  Patient presents with  . Drug Overdose   (Consider location/radiation/quality/duration/timing/severity/associated sxs/prior Treatment) Patient is a 23 y.o. male presenting with Overdose. The history is provided by the patient.  Drug Overdose This is a new problem. Pertinent negatives include no chest pain, no abdominal pain, no headaches and no shortness of breath.   patient is a heroin abuser. He had been clean for just over 2 months when he goes to around $20 worth today. He became less responsive and laid down in a parking lot. Upon EMS arrival he was breathing 40 times a minute. He improved with oxygen and stimulation. Patient denies suicide attempt. He has been in recent treatment. He denies other substance abuse.  Past Medical History  Diagnosis Date  . Migraine headache   . Depression   . ADD (attention deficit disorder)   . Asthma   . Allergy   . Substance abuse    Past Surgical History  Procedure Laterality Date  . Hernia repair  age 38 [id number]    double  . Tonsillectomy and adenoidectomy     Family History  Problem Relation Age of Onset  . Diabetes Other     1st degree relative  . Hyperlipidemia Other   . Cancer Other     lung, prostate  . Stroke Other   . Heart disease Other    History  Substance Use Topics  . Smoking status: Former Games developermoker  . Smokeless tobacco: Former NeurosurgeonUser     Comment: quit 2011  . Alcohol Use: Yes     Comment: occassionally    Review of Systems  Constitutional: Negative for activity change and appetite change.  Eyes: Negative for pain.  Respiratory: Negative for chest tightness and shortness of breath.   Cardiovascular: Negative for chest pain and leg swelling.  Gastrointestinal: Negative for nausea, vomiting, abdominal pain and diarrhea.  Genitourinary: Negative for flank pain.  Musculoskeletal:  Negative for back pain and neck stiffness.  Skin: Negative for rash.  Neurological: Positive for syncope. Negative for weakness, numbness and headaches.  Psychiatric/Behavioral: Negative for behavioral problems.    Allergies  Ceclor  Home Medications   Current Outpatient Rx  Name  Route  Sig  Dispense  Refill  . albuterol (PROVENTIL HFA;VENTOLIN HFA) 108 (90 BASE) MCG/ACT inhaler   Inhalation   Inhale 2 puffs into the lungs every 6 (six) hours as needed. For shortness of breath   1 Inhaler   11    BP 138/79  Pulse 94  Temp(Src) 97.5 F (36.4 C) (Oral)  Resp 18  Ht 6\' 1"  (1.854 m)  Wt 230 lb (104.327 kg)  BMI 30.35 kg/m2  SpO2 98% Physical Exam  Nursing note and vitals reviewed. Constitutional: He is oriented to person, place, and time. He appears well-developed and well-nourished.  HENT:  Head: Normocephalic and atraumatic.  Eyes: EOM are normal. Pupils are equal, round, and reactive to light.  Neck: Normal range of motion. Neck supple.  Cardiovascular: Normal rate, regular rhythm and normal heart sounds.   No murmur heard. Pulmonary/Chest: Effort normal and breath sounds normal.  Abdominal: Soft. Bowel sounds are normal. He exhibits no distension and no mass. There is no tenderness. There is no rebound and no guarding.  Musculoskeletal: Normal range of motion. He exhibits no edema.  Neurological: He is alert and oriented  to person, place, and time. No cranial nerve deficit.  Patient is somewhat sleepy and will close his eyes.  Skin: Skin is warm and dry.  Psychiatric: He has a normal mood and affect.    ED Course  Procedures (including critical care time) Labs Review Labs Reviewed - No data to display Imaging Review No results found.  EKG Interpretation   None       MDM   1. Accidental heroin overdose    Patient of heroin overdose. Denies suicide attempt. Does not need detox as he has only used once in the last 2 months. Has had some episodes of  hypoxia. Will continue to monitor.Have discussed with TTS, and patient has been given followup resources. Suggested  [facility]    Juliet Rude. Rubin Payor, MD 12/25/13 [phone]

## 2013-12-25 NOTE — ED Notes (Signed)
Per EMS pt shot up heroine in a restroom in a convenient store ran out of the store and collapsed in the parking lot  When fire team got there pt was breathing 4 times a minute  Initially they bagged pt then switched him to a nonrebreather mask  IV was started by EMS and pt roused  Pt is alert  Pt is on oxygen at 2 liters/mn via [place] with O2 sat of 97%  Pt states this is the first time he has used in over a month

## 2013-12-25 NOTE — Discharge Instructions (Signed)
Narcotic Overdose  A narcotic overdose is the misuse or overuse of a narcotic drug. A narcotic overdose can make you pass out and stop breathing. If you are not treated right away, this can cause permanent brain damage or stop your heart. Medicine may be given to reverse the effects of an overdose. If so, this medicine may bring on withdrawal symptoms. The symptoms may be abdominal cramps, throwing up (vomiting), sweating, chills, and nervousness.  Injecting narcotics can cause more problems than just an overdose. AIDS, hepatitis, and other very serious infections are transmitted by sharing needles and syringes. If you decide to quit using, there are medicines which can help you through the withdrawal period. Trying to quit all at once on your own can be uncomfortable, but not life-threatening. Call your caregiver, Narcotics Anonymous, or any drug and alcohol treatment program for further help.   Document Released: 12/29/2004 Document Revised: 02/13/2012 Document Reviewed: 10/23/2009  [employer] Patient Information 2014 [employer]

## 2014-02-11 ENCOUNTER — Other Ambulatory Visit: Payer: Self-pay | Admitting: Family Medicine

## 2014-04-24 ENCOUNTER — Emergency Department (HOSPITAL_COMMUNITY)
Admission: EM | Admit: 2014-04-24 | Discharge: 2014-04-24 | Disposition: A | Discharge status: Home / Self Care | Payer: BC Managed Care – PPO | Attending: Emergency Medicine | Admitting: Emergency Medicine

## 2014-04-24 ENCOUNTER — Encounter (HOSPITAL_COMMUNITY): Payer: Self-pay | Admitting: Emergency Medicine

## 2014-04-24 DIAGNOSIS — Z8659 Personal history of other mental and behavioral disorders: Secondary | ICD-10-CM | POA: Insufficient documentation

## 2014-04-24 DIAGNOSIS — J45901 Unspecified asthma with (acute) exacerbation: Secondary | ICD-10-CM

## 2014-04-24 DIAGNOSIS — Z8679 Personal history of other diseases of the circulatory system: Secondary | ICD-10-CM | POA: Insufficient documentation

## 2014-04-24 DIAGNOSIS — Z87891 Personal history of nicotine dependence: Secondary | ICD-10-CM | POA: Insufficient documentation

## 2014-04-24 MED ORDER — ALBUTEROL SULFATE HFA 108 (90 BASE) MCG/ACT IN AERS
2.0000 | INHALATION_SPRAY | RESPIRATORY_TRACT | Status: DC | PRN
  Filled 2014-04-24: qty 6.7

## 2014-04-24 MED ORDER — PREDNISONE 20 MG PO TABS
60.0000 mg | ORAL_TABLET | Freq: Once | ORAL | Status: AC
  Administered 2014-04-24: 60 mg via ORAL
  Filled 2014-04-24: qty 3

## 2014-04-24 MED ORDER — PREDNISONE 20 MG PO TABS: ORAL_TABLET | ORAL | Status: DC

## 2014-04-24 MED ORDER — IPRATROPIUM-ALBUTEROL 0.5-2.5 (3) MG/3ML IN SOLN
3.0000 mL | RESPIRATORY_TRACT | Status: DC
  Administered 2014-04-24: 3 mL via RESPIRATORY_TRACT
  Filled 2014-04-24: qty 3

## 2014-04-24 NOTE — ED Notes (Signed)
Patient presents to ED via POV. Pt c/o of "asthma attack" that began at approx 0400. Patient states that he is out of his albuterol inhaler. Pt presents with audible expiratory wheezes. Airway intact.

## 2014-04-24 NOTE — ED Provider Notes (Signed)
CSN: [id number]     Arrival date & time 04/24/14  0609 History   First MD Initiated Contact with Patient 04/24/14 [phone]     Chief Complaint  Patient presents with  . Asthma     (Consider location/radiation/quality/duration/timing/severity/associated sxs/prior Treatment) HPI  23 year old male with history of asthma, allergies, and some disease who presents to the ED with complaints of asthma exacerbation. Patient reports he usually uses inhaler at least once daily but ran out of inhaler for the past 3 months. This morning he was awoke with chest tightness and trouble breathing that felt similar to prior asthma attack. He does not know what precipitated his symptoms. He felt normal the night before. He denies any fever, chills, productive cough, hemoptysis, headache, chest pain, or rash. He denies any prior history of intubation and ICU stay secondary to asthma complication. He admits to smoking "a lot of cigarettes". Currently has no other complaint. Denies any change in his environment, no new pets, no other precipitating factors.  Past Medical History  Diagnosis Date  . Migraine headache   . Depression   . ADD (attention deficit disorder)   . Asthma   . Allergy   . Substance abuse    Past Surgical History  Procedure Laterality Date  . Hernia repair  age 15    double  . Tonsillectomy and adenoidectomy     Family History  Problem Relation Age of Onset  . Diabetes Other     1st degree relative  . Hyperlipidemia Other   . Cancer Other     lung, prostate  . Stroke Other   . Heart disease Other    History  Substance Use Topics  . Smoking status: Former Games developermoker  . Smokeless tobacco: Former NeurosurgeonUser     Comment: quit 2011  . Alcohol Use: Yes     Comment: occassionally    Review of Systems  Constitutional: Negative for fever.  Respiratory: Positive for shortness of breath and wheezing.   Cardiovascular: Negative for chest pain.  Gastrointestinal: Negative for abdominal pain.   Skin: Negative for rash.  All other systems reviewed and are negative.     Allergies  Ceclor  Home Medications   Prior to Admission medications   Not on File   BP 121/77  Pulse 79  Temp(Src) 97.9 F (36.6 C) (Oral)  Resp 20  Ht 6\' 1"  (1.854 m)  Wt 220 lb (99.791 kg)  BMI 29.03 kg/m2  SpO2 97% Physical Exam  Constitutional: He is oriented to person, place, and time. He appears well-developed and well-nourished. No distress.  HENT:  Head: Atraumatic.  Eyes: Conjunctivae are normal.  Neck: Normal range of motion. Neck supple.  Cardiovascular: Normal rate and regular rhythm.   Pulmonary/Chest: Effort normal. No respiratory distress (able to speak in complete sentences, no accessory muscle use.). He has wheezes (Expiratory wheezes). He has no rales. He exhibits no tenderness.  Neurological: He is alert and oriented to person, place, and time.  Skin: No rash noted.  Psychiatric: He has a normal mood and affect.    ED Course  Procedures (including critical care time)  6:40 AM Patient with history of asthma who ran out of his rescue inhaler and is here with active wheezing. Patient is currently receiving albuterol and Atrovent nebs and felt much better. He does have some respiratory wheezing but otherwise without any significant respiratory distress. He does not have any symptoms to suggest infection.  7:13 AM Patient shows significant improvement after receiving  breathing treatments. He is able to ambulate without any drop in his O2 status. He felt much better and stable for discharge.  Labs  Review Labs Reviewed - No data to display  Imaging Review No results found.   EKG Interpretation None      MDM   Final diagnoses:  Asthma exacerbation    BP 116/75  Pulse 68  Temp(Src) 97.9 F (36.6 C) (Oral)  Resp 20  Ht 6\' 1"  (1.854 m)  Wt 220 lb (99.791 kg)  BMI 29.03 kg/m2  SpO2 96%     Fayrene HelperBowie Kidus Delman, PA-C 04/24/14 [phone]

## 2014-04-24 NOTE — Discharge Instructions (Signed)
Asthma Attack Prevention Although there is no way to prevent asthma from starting, you can take steps to control the disease and reduce its symptoms. Learn about your asthma and how to control it. Take an active role to control your asthma by working with your health care provider to create and follow an asthma action plan. An asthma action plan guides you in:  Taking your medicines properly.  Avoiding things that set off your asthma or make your asthma worse (asthma triggers).  Tracking your level of asthma control.  Responding to worsening asthma.  Seeking emergency care when needed. To track your asthma, keep records of your symptoms, check your peak flow number using a handheld device that shows how well air moves out of your lungs (peak flow meter), and get regular asthma checkups.  WHAT ARE SOME WAYS TO PREVENT AN ASTHMA ATTACK?  Take medicines as directed by your health care provider.  Keep track of your asthma symptoms and level of control.  With your health care provider, write a detailed plan for taking medicines and managing an asthma attack. Then be sure to follow your action plan. Asthma is an ongoing condition that needs regular monitoring and treatment.  Identify and avoid asthma triggers. Many outdoor allergens and irritants (such as pollen, mold, cold air, and air pollution) can trigger asthma attacks. Find out what your asthma triggers are and take steps to avoid them.  Monitor your breathing. Learn to recognize warning signs of an attack, such as coughing, wheezing, or shortness of breath. Your lung function may decrease before you notice any signs or symptoms, so regularly measure and record your peak airflow with a home peak flow meter.  Identify and treat attacks early. If you act quickly, you are less likely to have a severe attack. You will also need less medicine to control your symptoms. When your peak flow measurements decrease and alert you to an upcoming attack,  take your medicine as instructed and immediately stop any activity that may have triggered the attack. If your symptoms do not improve, get medical help.  Pay attention to increasing quick-relief inhaler use. If you find yourself relying on your quick-relief inhaler, your asthma is not under control. See your health care provider about adjusting your treatment. WHAT CAN MAKE MY SYMPTOMS WORSE? A number of common things can set off or make your asthma symptoms worse and cause temporary increased inflammation of your airways. Keep track of your asthma symptoms for several weeks, detailing all the environmental and emotional factors that are linked with your asthma. When you have an asthma attack, go back to your asthma diary to see which factor, or combination of factors, might have contributed to it. Once you know what these factors are, you can take steps to control many of them. If you have allergies and asthma, it is important to take asthma prevention steps at home. Minimizing contact with the substance to which you are allergic will help prevent an asthma attack. Some triggers and ways to avoid these triggers are: Animal Dander:  Some people are allergic to the flakes of skin or dried saliva from animals with fur or feathers.   There is no such thing as a hypoallergenic dog or cat breed. All dogs or cats can cause allergies, even if they don't shed.  Keep these pets out of your home.  If you are not able to keep a pet outdoors, keep the pet out of your bedroom and other sleeping areas at all  times, and keep the door closed.  Remove carpets and furniture covered with cloth from your home. If that is not possible, keep the pet away from fabric-covered furniture and carpets. Dust Mites: Many people with asthma are allergic to dust mites. Dust mites are tiny bugs that are found in every home in mattresses, pillows, carpets, fabric-covered furniture, bedcovers, clothes, stuffed toys, and other  fabric-covered items.   Cover your mattress in a special dust-proof cover.  Cover your pillow in a special dust-proof cover, or wash the pillow each week in hot water. Water must be hotter than 130 F (54.4 C) to kill dust mites. Cold or warm water used with detergent and bleach can also be effective.  Wash the sheets and blankets on your bed each week in hot water.  Try not to sleep or lie on cloth-covered cushions.  Call ahead when traveling and ask for a smoke-free hotel room. Bring your own bedding and pillows in case the hotel only supplies feather pillows and down comforters, which may contain dust mites and cause asthma symptoms.  Remove carpets from your bedroom and those laid on concrete, if you can.  Keep stuffed toys out of the bed, or wash the toys weekly in hot water or cooler water with detergent and bleach. Cockroaches: Many people with asthma are allergic to the droppings and remains of cockroaches.   Keep food and garbage in closed containers. Never leave food out.  Use poison baits, traps, powders, gels, or paste (for example, boric acid).  If a spray is used to kill cockroaches, stay out of the room until the odor goes away. Indoor Mold:  Fix leaky faucets, pipes, or other sources of water that have mold around them.  Clean floors and moldy surfaces with a fungicide or diluted bleach.  Avoid using humidifiers, vaporizers, or swamp coolers. These can spread molds through the air. Pollen and Outdoor Mold:  When pollen or mold spore counts are high, try to keep your windows closed.  Stay indoors with windows closed from late morning to afternoon. Pollen and some mold spore counts are highest at that time.  Ask your health care provider whether you need to take anti-inflammatory medicine or increase your dose of the medicine before your allergy season starts. Other Irritants to Avoid:  Tobacco smoke is an irritant. If you smoke, ask your health care provider how  you can quit. Ask family members to quit smoking too. Do not allow smoking in your home or car.  If possible, do not use a wood-burning stove, kerosene heater, or fireplace. Minimize exposure to all sources of smoke, including to incense, candles, fires, and fireworks.  Try to stay away from strong odors and sprays, such as perfume, talcum powder, hair spray, and paints.  Decrease humidity in your home and use an indoor air cleaning device. Reduce indoor humidity to below 60%. Dehumidifiers or central air conditioners can do this.  Decrease house dust exposure by changing furnace and air cooler filters frequently.  Try to have someone else vacuum for you once or twice a week. Stay out of rooms while they are being vacuumed and for a short while afterward.  If you vacuum, use a dust mask from a hardware store, a double-layered or microfilter vacuum cleaner bag, or a vacuum cleaner with a HEPA filter.  Sulfites in foods and beverages can be irritants. Do not drink beer or wine or eat dried fruit, processed potatoes, or shrimp if they cause asthma symptoms.  Cold air can trigger an asthma attack. Cover your nose and mouth with a scarf on cold or windy days.  Several health conditions can make asthma more difficult to manage, including a runny nose, sinus infections, reflux disease, psychological stress, and sleep apnea. Work with your health care provider to manage these conditions.  Avoid close contact with people who have a respiratory infection such as a cold or the flu, since your asthma symptoms may get worse if you catch the infection. Wash your hands thoroughly after touching items that may have been handled by people with a respiratory infection.  Get a flu shot every year to protect against the flu virus, which often makes asthma worse for days or weeks. Also get a pneumonia shot if you have not previously had one. Unlike the flu shot, the pneumonia shot does not need to be given  yearly. Medicines:  Talk to your health care provider about whether it is safe for you to take aspirin or non-steroidal anti-inflammatory medicines (NSAIDs). In a small number of people with asthma, aspirin and NSAIDs can cause asthma attacks. These medicines must be avoided by people who have known aspirin-sensitive asthma. It is important that people with aspirin-sensitive asthma read labels of all over-the-counter medicines used to treat pain, colds, coughs, and fever.  Beta blockers and ACE inhibitors are other medicines you should discuss with your health care provider. HOW CAN I FIND OUT WHAT I AM ALLERGIC TO? Ask your asthma health care provider about allergy skin testing or blood testing (the RAST test) to identify the allergens to which you are sensitive. If you are found to have allergies, the most important thing to do is to try to avoid exposure to any allergens that you are sensitive to as much as possible. Other treatments for allergies, such as medicines and allergy shots (immunotherapy) are available.  CAN I EXERCISE? Follow your health care provider's advice regarding asthma treatment before exercising. It is important to maintain a regular exercise program, but vigorous exercise, or exercise in cold, humid, or dry environments can cause asthma attacks, especially for those people who have exercise-induced asthma. Document Released: 11/09/2009 Document Revised: 07/24/2013 Document Reviewed: 05/29/2013 [facility] Patient Information 2014 [place], [place].   Emergency Department Resource Guide 1) Find a Doctor and Pay Out of Pocket Although you won't have to find out who is covered by your insurance plan, it is a good idea to ask around and get recommendations. You will then need to call the office and see if the doctor you have chosen will accept you as a new patient and what types of options they offer for patients who are self-pay. Some doctors offer discounts or will set up payment  plans for their patients who do not have insurance, but you will need to ask so you aren't surprised when you get to your appointment.  2) Contact Your Local Health Department Not all health departments have doctors that can see patients for sick visits, but many do, so it is worth a call to see if yours does. If you don't know where your local health department is, you can check in your phone book. The CDC also has a tool to help you locate your state's health department, and many state websites also have listings of all of their local health departments.  3) Find a Walk-in Clinic If your illness is not likely to be very severe or complicated, you may want to try a walk in clinic. These are popping  up all over the country in pharmacies, drugstores, and shopping centers. They're usually staffed by nurse practitioners or physician assistants that have been trained to treat common illnesses and complaints. They're usually fairly quick and inexpensive. However, if you have serious medical issues or chronic medical problems, these are probably not your best option.  No Primary Care Doctor: - Call Health Connect at  [phone] - they can help you locate a primary care doctor that  accepts your insurance, provides certain services, etc. - Physician Referral Service- [phone]  Chronic Pain Problems: Organization         Address  Phone   Notes  [facility]  [phone] Patients need to be referred by their primary care doctor.   Medication Assistance: Organization         Address  Phone   Notes  [facility] Medication [facility] [address] [place] [place], [place] [address] [phone] --Must be a resident of [facility] -- Must have NO insurance coverage whatsoever (no Medicaid/ Medicare, etc.) -- The pt. MUST have a primary care doctor that directs their care regularly and follows them in the community   [facility]  [phone]   [employer]   [phone]    Agencies that provide inexpensive medical care: Organization         Address  Phone   Notes  [facility]  [phone]   [facility]    [phone]   [facility] [address] [place], [place] [address] [phone]   [facility] [address] [place], [place] [phone]   Planned Parenthood    [phone]   [facility]    [phone]   Community Health and [facility]  [address], [place] Phone:  [phone], Fax:  [phone] Hours of Operation:  9 am - 6 pm, M-F.  Also accepts Medicaid/Medicare and self-pay.  [facility]  [address], [place] Phone: [phone], Fax: [phone]. Hours of Operation:  8:30 am - 5:30 pm, M-F.  Also accepts Medicaid and self-pay.  [facility] [place] [address] [place] Phone: [phone]   [facility] [address] Natasha Bence [place], [place] [phone], Ext. [phone] Mondays & Thursdays: 7-9 AM.  First 15 patients are seen on a first come, first serve basis.    Medicaid-accepting [facility] Providers:  Organization         Address  Phone   Notes  [facility] [address], [place] [phone] Also accepts self-pay patients.  [facility] [address] Laurell Josephs [place], [place]  [phone]   [facility] [address], [place] [phone]   [facility] [address], [place] [phone]   Renaye Rakers [address], [place]   [phone] Only accepts [place] patients after they have their name applied to their card.   Self-Pay (no insurance) in [facility]:  Organization         Address  Phone   Notes  Sickle Cell Patients, [facility] [address] [place], [place] [phone]   [facility] [address] [place], [place] [phone]   [facility] [place]  [phone]  [address], [place] [phone]   [facility]/Dr. Osei-Bonsu  [address] [place] [address], [place] or [address], [place] [phone] Phone number for both [place] and [place] locations is the same.  [facility]gent [facility] and [facility] [address], [place] [phone]   [facility] [place] [address] [place] [address], [place] or [address] [phone]   [facility] [address], [place] [phone], phone; [phone], fax Sees patients 1st and 3rd Saturday of every month.  Must not qualify for public or private insurance (i.e. Medicaid, Medicare, [place] Health Choice, Veterans' Benefits)  Household income should be no more than 200% of the poverty level The clinic cannot treat you if you are pregnant or think you are pregnant  Sexually transmitted diseases are not treated at the clinic.    Dental Care: Organization         Address  Phone  Notes  [facility] [address], [place] [phone] Accepts children up to age 23 who are enrolled in [place]icaid or [place] Health Choice; pregnant women with a Medicaid card; and children who have applied for Medicaid or [place] Health Choice, but were declined, whose parents can pay a reduced fee at time of service.  [facility] [place]  [address], [place] [phone] Accepts children up to age 23 who are enrolled in [place] or [place] Health Choice; pregnant women with a Medicaid card; and children who have applied for Medicaid or  Health Choice, but were declined, whose parents can pay a reduced fee at time of service.  [facility]  [address], [place] [phone] Patients are  seen by appointment only. Walk-ins are not accepted. [facility] will see patients 23 years of age and older. Monday - Tuesday (8am-5pm) Most Wednesdays (8:30-5pm) $30 per visit, cash only  [facility]  [address], [place] [facility] [phone] Patients are seen by appointment only. Walk-ins are not accepted. [facility] will see patients 23 years of age and older. One Wednesday Evening (Monthly: Volunteer Based).  $30 per visit, cash only  [employer] [facility]  [phone] for adults; Children under age 354, call Graduate Pediatric Dentistry at [phone]. Children aged 244-14, please call [phone] to request a pediatric application.  Dental services are provided in all areas of dental care including fillings, crowns and bridges, complete and partial dentures, implants, gum treatment, root canals, and extractions. Preventive care is also provided. Treatment is provided to both adults and children. Patients are selected via a lottery and there is often a waiting list.   [facility] [address], [place]  [phone] [email or web address]   [facility] [address], [place], [place] [phone], Ext. [phone] Second and Fourth Thursday of each month, opens at 6:30 AM; Clinic ends at 9 AM.  Patients are seen on a first-come first-served basis, and a limited number are seen during each clinic.   [facility]  [address], [place], [place] [phone]   Eligibility Requirements You must have lived in [place], [place], or [place] counties for at least the last three months.   You cannot be eligible for state or federal sponsored [place] Cityhealthcare insurance, including CIGNAVeterans Administration, [place]icaid, or [employer]Medicare.  You generally cannot be eligible for healthcare insurance through your employer.    How to apply: Eligibility screenings are held every Tuesday and Wednesday afternoon from 1:00 pm until 4:00  pm. You do not need an appointment for the interview!  [facility] [address], [place], [place] [phone]   [facility]  [phone]   [facility]  [phone]   [facility]  [phone]    Behavioral Health Resources in the Community: Intensive Outpatient Programs Organization         Address  Phone  Notes  [facility] [address], [place], [place] [phone]   [facility] [address], [place], [place] [phone]   [facility] [address], [place], [place]  [phone]   [facility] [address],  [place], [place] [phone] or [phone]   Substance Abuse Resources Organization         Address  Phone  Notes  [facility]  [phone]   [facility]  [phone]   [facility]  [phone]   [facility]  [phone]   Residential & Outpatient Substance Abuse Program  [phone]   Psychological Services Organization         Address  Phone  Notes  [facility]  [phone]- [phone]   [facility]  [phone]- [phone]   [facility] [address], [place] [phone] or [phone]    Mobile Crisis Teams Organization         Address  Phone  Notes  [facility], Mobile Crisis Care Unit  [phone]   [facility]  [address] [place], [place] [phone]   Doristine LocksSharon DeEsch [address] [place] [phone]    Self-Help/Support Groups Organization         Address  Phone             Notes  [facility] [place] - variety of support groups  [phone]- [phone] Call for more information  Narcotics Anonymous (NA), [facility] [address], [place]  2 meetings at this location   Statisticianesidential Treatment Programs Organization          Address  Phone  Notes  [facility] [address],    [place]  [phone]   [facility]  [address], [place] [phone], [place], [place] [phone]   [facility] [address], [place] [phone] Admissions: 8am-3pm M-F  [facility] [address]    [place], [place] [phone]   [facility] [address], [place], [place] [phone]   [facility] [address]  [place], [place] [phone]   [facility] - Intensive Outpatient [address], [place], [place] [phone]   [facility] [address]  [place], [place] [phone] or [phone]   [facility] ([facility]) [address] [place], [place] [phone] Accepts Medicaid  [facility] [address]  [place] [phone] Substance Abuse/Addiction Treatment   [facility] Resources Organization         Address  Phone  Notes  [facility]  [phone]   Angie FavaJulie Brannon, PhD [address] Ske[address] [place], [place]   [phone] or [phone]   [facility] [place]  [place], [place] [phone]   [facility] [address] [place], [place] [phone] Insurance/Medicaid/sponsorship through [facility] Families [address] [place], [place] [phone] [place], [place] [phone]    Dr. Adele Schilder  [phone]   [facility] Dept. 1) [address] [place] 2) [place] 3)  [address], [place] [phone]  [phone]   [place] [phone] or [phone] (After Hours)

## 2014-04-24 NOTE — ED Notes (Signed)
Patient ambulated in hall with this RN- O2 sats remained at 95% the entire time. Patient denies feeling dizzy, lightheaded or short of breath.

## 2014-04-24 NOTE — ED Notes (Signed)
Laveda Normanran, PA made aware of patient's O2 sat while ambulating. No new verbal orders given at this time.

## 2014-04-28 NOTE — ED Provider Notes (Signed)
Medical screening examination/treatment/procedure(s) were performed by non-physician practitioner and as supervising physician I was immediately available for consultation/collaboration.   EKG Interpretation None        Onia Shiflett T Iram Lundberg, MD 04/28/14 1539 

## 2014-05-15 ENCOUNTER — Encounter (HOSPITAL_COMMUNITY): Payer: Self-pay | Admitting: Emergency Medicine

## 2014-05-15 ENCOUNTER — Emergency Department (HOSPITAL_COMMUNITY)
Admission: EM | Admit: 2014-05-15 | Discharge: 2014-05-16 | Disposition: A | Discharge status: Home / Self Care | Payer: BC Managed Care – PPO | Attending: Emergency Medicine | Admitting: Emergency Medicine

## 2014-05-15 DIAGNOSIS — F329 Major depressive disorder, single episode, unspecified: Secondary | ICD-10-CM | POA: Insufficient documentation

## 2014-05-15 DIAGNOSIS — J45901 Unspecified asthma with (acute) exacerbation: Secondary | ICD-10-CM | POA: Insufficient documentation

## 2014-05-15 DIAGNOSIS — Z79899 Other long term (current) drug therapy: Secondary | ICD-10-CM | POA: Insufficient documentation

## 2014-05-15 DIAGNOSIS — F988 Other specified behavioral and emotional disorders with onset usually occurring in childhood and adolescence: Secondary | ICD-10-CM | POA: Insufficient documentation

## 2014-05-15 DIAGNOSIS — J45909 Unspecified asthma, uncomplicated: Secondary | ICD-10-CM

## 2014-05-15 DIAGNOSIS — IMO0002 Reserved for concepts with insufficient information to code with codable children: Secondary | ICD-10-CM | POA: Insufficient documentation

## 2014-05-15 DIAGNOSIS — Z760 Encounter for issue of repeat prescription: Secondary | ICD-10-CM

## 2014-05-15 DIAGNOSIS — F3289 Other specified depressive episodes: Secondary | ICD-10-CM | POA: Insufficient documentation

## 2014-05-15 DIAGNOSIS — F17200 Nicotine dependence, unspecified, uncomplicated: Secondary | ICD-10-CM | POA: Insufficient documentation

## 2014-05-15 MED ORDER — ALBUTEROL SULFATE HFA 108 (90 BASE) MCG/ACT IN AERS
2.0000 | INHALATION_SPRAY | RESPIRATORY_TRACT | Status: DC | PRN
  Administered 2014-05-15: 2 via RESPIRATORY_TRACT
  Filled 2014-05-15: qty 6.7

## 2014-05-15 MED ORDER — OPTICHAMBER ADVANTAGE MISC
1.0000 | Freq: Once | Status: AC
  Administered 2014-05-15: 1
  Filled 2014-05-15: qty 1

## 2014-05-15 MED ORDER — PREDNISONE 20 MG PO TABS
60.0000 mg | ORAL_TABLET | Freq: Once | ORAL | Status: AC
  Administered 2014-05-15: 60 mg via ORAL
  Filled 2014-05-15: qty 3

## 2014-05-15 MED ORDER — IPRATROPIUM-ALBUTEROL 0.5-2.5 (3) MG/3ML IN SOLN
3.0000 mL | Freq: Once | RESPIRATORY_TRACT | Status: AC
  Administered 2014-05-15: 3 mL via RESPIRATORY_TRACT
  Filled 2014-05-15: qty 3

## 2014-05-15 NOTE — ED Notes (Signed)
Patient states he ran out of both his inhaler and nebulized albuterol on Monday. Patient states he began wheezing about 3 hours ago. Patient with expiratory wheezes throughout all lung fields.

## 2014-05-15 NOTE — ED Provider Notes (Signed)
CSN: [id number]     Arrival date & time 05/15/14  2300 History   This chart was scribed for non-physician practitioner, Earley Favor, NP, working with Olivia Mackie, MD, by Bronson Curb, ED Scribe. This patient was seen in room WTR6/WTR6 and the patient's care was started at 11:29 PM.    Chief Complaint  Patient presents with  . Asthma      Patient is a 23 y.o. male presenting with asthma. The history is provided by the patient. No language interpreter was used.  Asthma This is a chronic problem. The current episode started 1 to 2 hours ago. The problem has not changed since onset.Associated symptoms include shortness of breath. Pertinent negatives include no chest pain, no abdominal pain and no headaches.  Asthma This is a chronic problem. The current episode started 1 to 2 hours ago. The problem has not changed since onset.Pertinent negatives include no abdominal pain, chest pain or headaches.    HPI Comments: Cesar Boyle is a 23 y.o. male who presents to the Emergency Department complaining of asthma onset 2 hours ago. There is associated SOB and palpitations. Patient reports that he is currently in between PCPs and states he has been unable to get his prescription refilled for his inhaler and nebulizer.  Past Medical History  Diagnosis Date  . Migraine headache   . Depression   . ADD (attention deficit disorder)   . Asthma   . Allergy   . Substance abuse    Past Surgical History  Procedure Laterality Date  . Hernia repair  age [age]    double  . Tonsillectomy and adenoidectomy     Family History  Problem Relation Age of Onset  . Diabetes Other     1st degree relative  . Hyperlipidemia Other   . Cancer Other     lung, prostate  . Stroke Other   . Heart disease Other   . Cancer Father    History  Substance Use Topics  . Smoking status: Current Some Day Smoker  . Smokeless tobacco: Former Neurosurgeon     Comment: quit 2011  . Alcohol Use: Yes     Comment:  occassionally    Review of Systems  Respiratory: Positive for shortness of breath.   Cardiovascular: Positive for palpitations. Negative for chest pain.  Gastrointestinal: Negative for abdominal pain.  Neurological: Negative for headaches.  All other systems reviewed and are negative.     Allergies  Ceclor  Home Medications   Prior to Admission medications   Medication Sig Start Date End Date Taking? Authorizing Provider  albuterol (PROVENTIL HFA;VENTOLIN HFA) 108 (90 BASE) MCG/ACT inhaler Inhale 2 puffs into the lungs every 4 (four) hours as needed for wheezing or shortness of breath. 05/16/14   Arman Filter, NP  albuterol (PROVENTIL) (2.5 MG/3ML) 0.083% nebulizer solution Take 3 mLs (2.5 mg total) by nebulization every 6 (six) hours as needed for wheezing or shortness of breath. 05/16/14   Arman Filter, NP  predniSONE (DELTASONE) 20 MG tablet 3 tabs po day one, then 2 tabs daily x 4 days 04/24/14   Fayrene Helper, PA-C   Triage Vitals: BP 123/70  Pulse 116  Temp(Src) 98.1 F (36.7 C) (Oral)  Resp 22  Ht 6\' 1"  (1.854 m)  Wt 220 lb (99.791 kg)  BMI 29.03 kg/m2  SpO2 99%  Physical Exam  Nursing note and vitals reviewed. Constitutional: He is oriented to person, place, and time. He appears well-developed and well-nourished.  No distress.  HENT:  Head: Normocephalic and atraumatic.  Eyes: Conjunctivae and EOM are normal.  Neck: Normal range of motion. No tracheal deviation present.  Cardiovascular: Normal rate.   Pulmonary/Chest: Effort normal. No respiratory distress.  Musculoskeletal: Normal range of motion.  Neurological: He is alert and oriented to person, place, and time.  Skin: Skin is warm and dry.  Psychiatric: He has a normal mood and affect. His behavior is normal.    ED Course  Procedures (including critical care time) Labs Review Labs Reviewed - No data to display  Imaging Review No results found.   EKG Interpretation None      MDM  Patient was  hereafter running out of his albuterol inhaler, as well as as albuterol nebulizer solution on Monday.  He's had asthma since he is a child is not required.  Any hospitalizations.  He is not having any chest pain, fever, or URI symptoms.  He was given one albuterol nebulizer here with total resolution of his symptoms.  He is been supplied with a inhaler.  He was given a prescription for inhaler, as well as for his nebulizer solution, and a resource list to help him find a primary care physician Final diagnoses:  Asthma  Medication refill        I personally performed the services described in this documentation, which was scribed in my presence. The recorded information has been reviewed and is accurate.    Arman FilterGail K Addie Alonge, NP 05/16/14 [phone]

## 2014-05-15 NOTE — ED Notes (Signed)
Pt states he is having an asthma attack   Pt states it started a couple hours ago   Pt states he ran out of his albuterol for his nebulizer  Pt states he is out of his inhaler too  Pt states he has been having heart palpitations for about the past hour  EKG obtained in triage

## 2014-05-16 MED ORDER — ALBUTEROL SULFATE HFA 108 (90 BASE) MCG/ACT IN AERS: 2.0000 | INHALATION_SPRAY | RESPIRATORY_TRACT | Status: DC | PRN

## 2014-05-16 MED ORDER — ALBUTEROL SULFATE (2.5 MG/3ML) 0.083% IN NEBU: 2.5000 mg | INHALATION_SOLUTION | Freq: Four times a day (QID) | RESPIRATORY_TRACT | Status: DC | PRN

## 2014-05-16 NOTE — Discharge Instructions (Signed)
Asthma, Adult Asthma is a condition of the lungs in which the airways tighten and narrow. Asthma can make it hard to breathe. Asthma cannot be cured, but medicine and lifestyle changes can help control it. Asthma may be started (triggered) by:  Animal skin flakes (dander).  Dust.  Cockroaches.  Pollen.  Mold.  Smoke.  Cleaning products.  Hair sprays or aerosol sprays.  Paint fumes or strong smells.  Cold air, weather changes, and winds.  Crying or laughing hard.  Stress.  Certain medicines or drugs.  Foods, such as dried fruit, potato chips, and sparkling grape juice.  Infections or conditions (colds, flu).  Exercise.  Certain medical conditions or diseases.  Exercise or tiring activities. HOME CARE   Take medicine as told by your doctor.  Use a peak flow meter as told by your doctor. A peak flow meter is a tool that measures how well the lungs are working.  Record and keep track of the peak flow meter's readings.  Understand and use the asthma action plan. An asthma action plan is a written plan for taking care of your asthma and treating your attacks.  To help prevent asthma attacks:  Do not smoke. Stay away from secondhand smoke.  Change your heating and air conditioning filter often.  Limit your use of fireplaces and wood stoves.  Get rid of pests (such as roaches and mice) and their droppings.  Throw away plants if you see mold on them.  Clean your floors. Dust regularly. Use cleaning products that do not smell.  Have someone vacuum when you are not home. Use a vacuum cleaner with a HEPA filter if possible.  Replace carpet with wood, tile, or vinyl flooring. Carpet can trap animal skin flakes and dust.  Use allergy-proof pillows, mattress covers, and box spring covers.  Wash bed sheets and blankets every week in hot water and dry them in a dryer.  Use blankets that are made of polyester or cotton.  Clean bathrooms and kitchens with bleach.  If possible, have someone repaint the walls in these rooms with mold-resistant paint. Keep out of the rooms that are being cleaned and painted.  Wash hands often. GET HELP IF:  You have make a whistling sound when breaking (wheeze), have shortness of breath, or have a cough even if taking medicine to prevent attacks.  The colored mucus you cough up (sputum) is thicker than usual.  The colored mucus you cough up changes from clear or white to yellow, green, gray, or bloody.  You have problems from the medicine you are taking such as:  A rash.  Itching.  Swelling.  Trouble breathing.  You need reliever medicines more than 2 3 times a week.  Your peak flow measurement is still at 50 79% of your personal best after following the action plan for 1 hour. GET HELP RIGHT AWAY IF:   You seem to be worse and are not responding to medicine during an asthma attack.  You are short of breath even at rest.  You get short of breath when doing very little activity.  You have trouble eating, drinking, or talking.  You have chest pain.  You have a fast heartbeat.  Your lips or fingernails start to turn blue.  You are lightheaded, dizzy, or faint.  Your peak flow is less than 50% of your personal best.  You have a fever or lasting symptoms for more than 2 3 days.  You have a fever and your symptoms suddenly  get worse. °MAKE SURE YOU:  °· Understand these instructions. °· Will watch your condition. °· Will get help right away if you are not doing well or get worse. °Document Released: 05/09/2008 Document Revised: 09/11/2013 Document Reviewed: 06/20/2013 °ExitCare® Patient Information ©2014 [employer] LLC. ° °Emergency Department Resource Guide °1) Find a Doctor and Pay Out of Pocket °Although you won't have to find out who is covered by your insurance plan, it is a good idea to ask around and get recommendations. You will then need to call the office and see if the doctor you have chosen will  accept you as a new patient and what types of options they offer for patients who are self-pay. Some doctors offer discounts or will set up payment plans for their patients who do not have insurance, but you will need to ask so you aren't surprised when you get to your appointment. ° °2) Contact Your Local Health Department °Not all health departments have doctors that can see patients for sick visits, but many do, so it is worth a call to see if yours does. If you don't know where your local health department is, you can check in your phone book. The CDC also has a tool to help you locate your state's health department, and many state websites also have listings of all of their local health departments. ° °3) Find a Walk-in Clinic °If your illness is not likely to be very severe or complicated, you may want to try a walk in clinic. These are popping up all over the country in pharmacies, drugstores, and shopping centers. They're usually staffed by nurse practitioners or physician assistants that have been trained to treat common illnesses and complaints. They're usually fairly quick and inexpensive. However, if you have serious medical issues or chronic medical problems, these are probably not your best option. ° °No Primary Care Doctor: °- Call Health Connect at  [phone] - they can help you locate a primary care doctor that  accepts your insurance, provides certain services, etc. °- Physician Referral Service- 1-800-533-3463 ° °Chronic Pain Problems: °Organization         Address  Phone   Notes  °[facility]  ([phone] Patients need to be referred by their primary care doctor.  ° °Medication Assistance: °Organization         Address  Phone   Notes  °[place] [facility] [address] °[place], [place] [address] [phone]) [phone] --Must be a resident of [place] °-- Must have NO insurance coverage whatsoever (no Medicaid/ Medicare, etc.) °-- The pt.  MUST have a primary care doctor that directs their care regularly and follows them in the community °  °[facility]ist  (866) 331-1348   °United Way  (888) 892-1162   ° °Agencies that provide inexpensive medical care: °Organization         Address  Phone   Notes  °[facility]  ([phone]) [phone]   °[facility]    ([phone]) [phone]   °[facility] [address] °[place], [place] [address] [phone]) [phone]   °[facility] [address], °[place] ([phone]) [phone]   °Planned Parenthood    (336) 373-0678   °[facility]    (336) 272-1050   °[facility] ° [address], [place] Phone:  ([phone]) [phone], Fax:  [phone] Hours of Operation:  9 am - 6 pm, M-F.    Also accepts Medicaid/Medicare and self-pay.  [facility] for [place] [address], [address], [place] Phone: [phone], Fax: [phone]. Hours of Operation:  8:30 am - 5:30 pm, M-F.  Also accepts Medicaid and self-pay.  [facility] [place] [address], [place] Phone: [phone]   [place], [place], [place] [phone], Ext. [phone] Mondays & Thursdays: 7-9 AM.  First 15 patients are seen on a first come, first serve basis.    [place] Providers:  Organization         Address  Phone   Notes  [facility] [address], [place] [phone] Also accepts self-pay patients.  [facility] [address], [place]  [phone]   [place], [address], [place] [phone]   [facility] [address] [place] [phone]   Lucianne Lei [address], [place]   [phone] Only accepts [place] Access [place] patients after they have their name applied to their card.   Self-Pay (no insurance) in  [facility]:  Organization         Address  Phone   Notes  Sickle Cell Patients, [facility] Internal [facility] [place] [phone]   [facility] [place] [phone]   [facility] [place]  [place], [place], [place] [phone]   [facility]/Dr. Osei-Bonsu  [address] [place] or [address], [place] [phone] Phone number for both [place] and [place] locations is the same.  [facility] and [facility] [address], [place] [phone]   [facility] [address] [place] or [address] [phone]   [facility] [address] [place] [phone], phone; [phone], fax Sees patients 1st and 3rd Saturday of every month.  Must not qualify for public or private insurance (i.e. Medicaid, Medicare, [place] Health Choice, Veterans' Benefits)  Household income should be no more than 200% of the poverty level The clinic cannot treat you if you are pregnant or think you are pregnant  Sexually transmitted diseases are not treated at the clinic.    Dental Care: Organization         Address  Phone  Notes  [facility] Department of [facility] [place] [phone] Accepts children up to age 53 who are enrolled in [place] or [place]; pregnant women with a Medicaid card; and children who have applied for Medicaid or [place] Health Choice, but were declined, whose parents can pay a reduced fee at time of service.  [facility] Department of [facility]  [address], [place] [phone] Accepts children up to age 86 who are enrolled in [place] or [place]; pregnant women with a Medicaid card; and children who have applied for Medicaid or [place] Health Choice, but were declined, whose  parents can pay a reduced fee at time of service.  [place] Adult [facility] Access PRO[facility]  [place] [phone] Patients are seen by appointment only. Walk-ins are not accepted. [place] will see patients 23 years of age and older. Monday - Tuesday (  8am-5pm) Most Wednesdays (8:30-5pm) $30 per visit, cash only  [facility] Adult Dental Access PROGRAM  [address], [facility] [phone] Patients are seen by appointment only. Walk-ins are not accepted. [place] will see patients 23 years of age and older. One Wednesday Evening (Monthly: Volunteer Based).  $30 per visit, cash only  [place]  [phone] for adults; Children under age 77, call Graduate Pediatric Dentistry at [phone]. Children aged 58-14, please call [phone] to request a pediatric application.  Dental services are provided in all areas of dental care including fillings, crowns and bridges, complete and partial dentures, implants, gum treatment, root canals, and extractions. Preventive care is also provided. Treatment is provided to both adults and children. Patients are selected via a lottery and there is often a waiting list.   [facility] [address], [place]  [phone] [email or web address]   [facility] [address] [place], [place] [phone], Ext. [phone] Second and Fourth Thursday of each month, opens at 6:30 AM; Clinic ends at 9 AM.  Patients are seen on a first-come first-served basis, and a limited number are seen during each clinic.   [facility]  [address] Hillard Danker [place], [place] [phone]   Eligibility Requirements You must have lived in [place], [place], or [place] counties for at least the last three months.   You cannot be eligible for state or federal sponsored [employer], including [employer], [place], or [employer].   You generally cannot be eligible for  healthcare insurance through your employer.    How to apply: Eligibility screenings are held every Tuesday and Wednesday afternoon from 1:00 pm until 4:00 pm. You do not need an appointment for the interview!  [facility] [address] [place], [place]   [place]  [place] Department  [place]  [phone]    Behavioral Health Resources in the Community: Intensive Outpatient Programs Organization         Address  Phone  Notes  [place]. [address] [place], [place] [phone]   [facility] Outpatient [address] [place], [place]   ADS: [facility] [address] [place], [place]   [place] [address],  [place], [place] or [phone]   Substance Abuse Resources Organization         Address  Phone  Notes  Alcohol and Drug Services  (581) 230-7655   [place]  [phone]   [facility] [place]   Chinita Pester  [phone]   Residential & Outpatient Substance Abuse Program  (203)629-4373   Psychological Services Organization         Address  Phone  Notes  [facility] [place]  [place]  ([phone]   [place] [address] [place] or [phone]    Mobile Crisis Teams Organization         Address  Phone  Notes  [facility], Mobile Crisis Care Unit  [phone]   [facility]  [address] [place], [place]   [place] Levels [address] [place] [phone]    Self-Help/Support Groups Organization         Address  Phone  Notes  Mental Health Assoc. of  [place] - variety of support groups  336- [phone] Call for more information  Narcotics  Anonymous (NA), [facility] [address], [place]  2 meetings at this location   Statisticianesidential Treatment Programs Organization         Address  Phone  Notes  [facility] [address],    [place]  [phone]   [facility]  [address], [place] [phone], [place], [place] [phone]   [facility] [address], [place] [phone] Admissions: 8am-3pm M-F  [facility] [address]    [place], [place] [phone]   [facility] [address], [place], [place] [phone]   [facility] [address]  [place], [place] [phone]   [facility] - Intensive Outpatient [address], [place], [place] [phone]   [facility] ([facility] [address]  [place], [place] [phone] or [phone]   [facility] ([facility]) [address] [place], [place] [phone] Accepts Medicaid  [facility] [address]  [place] [phone] Substance Abuse/Addiction Treatment   [facility] Behavioral Health Resources Organization         Address  Phone  Notes  [facility]  [phone](888) [phone]   Angie FavaJulie Brannon, PhD [address], [address] [place], [place]   [phone]) [phone] or [phone]) [phone]   [facility] [place]   [address] [place], [place] [phone](336) [phone]   [facility] [address], [place], [place] [phone](336) [phone] Insurance/Medicaid/sponsorship through [facility]  [facility] [address]                                    [place], [place] [phone]) [phone] Therapy/tele-psych/case  [facility] [address] [place] [address]   [place], [place] [phone]) [phone]    Dr. Lolly MustacheArfeen  [phone](336) [phone]   [facility] [place]  [employer] [facility] 1) [address], [place] 2) [address], [place] 3)  [address], [place] [phone](336) [phone](336)  [phone]  [phone]   [facility] Child Abuse Hotline [phone](336) ([phone] or [phone](336) [phone] (After Hours)     Use the above listed to help find a primary care physician in the community

## 2014-05-16 NOTE — ED Provider Notes (Signed)
Medical screening examination/treatment/procedure(s) were performed by non-physician practitioner and as supervising physician I was immediately available for consultation/collaboration.   EKG Interpretation None       Fayelynn Distel M Lydiana Milley, MD 05/16/14 0600 

## 2015-06-22 ENCOUNTER — Encounter (HOSPITAL_COMMUNITY): Payer: Self-pay | Admitting: Emergency Medicine

## 2015-06-22 DIAGNOSIS — R109 Unspecified abdominal pain: Secondary | ICD-10-CM | POA: Diagnosis not present

## 2015-06-22 DIAGNOSIS — J45909 Unspecified asthma, uncomplicated: Secondary | ICD-10-CM | POA: Insufficient documentation

## 2015-06-22 DIAGNOSIS — Z8659 Personal history of other mental and behavioral disorders: Secondary | ICD-10-CM | POA: Diagnosis not present

## 2015-06-22 DIAGNOSIS — R42 Dizziness and giddiness: Secondary | ICD-10-CM | POA: Diagnosis not present

## 2015-06-22 DIAGNOSIS — R079 Chest pain, unspecified: Secondary | ICD-10-CM | POA: Diagnosis not present

## 2015-06-22 DIAGNOSIS — Z8679 Personal history of other diseases of the circulatory system: Secondary | ICD-10-CM | POA: Diagnosis not present

## 2015-06-22 DIAGNOSIS — R197 Diarrhea, unspecified: Secondary | ICD-10-CM | POA: Insufficient documentation

## 2015-06-22 DIAGNOSIS — K920 Hematemesis: Secondary | ICD-10-CM | POA: Diagnosis present

## 2015-06-22 DIAGNOSIS — R112 Nausea with vomiting, unspecified: Secondary | ICD-10-CM | POA: Insufficient documentation

## 2015-06-22 DIAGNOSIS — R509 Fever, unspecified: Secondary | ICD-10-CM | POA: Diagnosis not present

## 2015-06-22 DIAGNOSIS — Z720 Tobacco use: Secondary | ICD-10-CM | POA: Diagnosis not present

## 2015-06-22 LAB — COMPREHENSIVE METABOLIC PANEL
ALK PHOS: 45 U/L (ref 38–126)
ALT: 20 U/L (ref 17–63)
ANION GAP: 11 (ref 5–15)
AST: 25 U/L (ref 15–41)
Albumin: 4.4 g/dL (ref 3.5–5.0)
BUN: 11 mg/dL (ref 6–20)
CALCIUM: 9.6 mg/dL (ref 8.9–10.3)
CHLORIDE: 106 mmol/L (ref 101–111)
CO2: 25 mmol/L (ref 22–32)
CREATININE: 1.02 mg/dL (ref 0.61–1.24)
Glucose, Bld: 115 mg/dL — ABNORMAL HIGH (ref 65–99)
POTASSIUM: 4.1 mmol/L (ref 3.5–5.1)
Sodium: 142 mmol/L (ref 135–145)
TOTAL PROTEIN: 6.8 g/dL (ref 6.5–8.1)
Total Bilirubin: 1.1 mg/dL (ref 0.3–1.2)

## 2015-06-22 LAB — CBC WITH DIFFERENTIAL/PLATELET
BASOS PCT: 0 % (ref 0–1)
Basophils Absolute: 0 10*3/uL (ref 0.0–0.1)
EOS ABS: 0.4 10*3/uL (ref 0.0–0.7)
EOS PCT: 3 % (ref 0–5)
HEMATOCRIT: 44.2 % (ref 39.0–52.0)
HEMOGLOBIN: 15.3 g/dL (ref 13.0–17.0)
LYMPHS ABS: 0.7 10*3/uL (ref 0.7–4.0)
Lymphocytes Relative: 6 % — ABNORMAL LOW (ref 12–46)
MCH: 31.2 pg (ref 26.0–34.0)
MCHC: 34.6 g/dL (ref 30.0–36.0)
MCV: 90.2 fL (ref 78.0–100.0)
MONOS PCT: 5 % (ref 3–12)
Monocytes Absolute: 0.6 10*3/uL (ref 0.1–1.0)
Neutro Abs: 10.1 10*3/uL — ABNORMAL HIGH (ref 1.7–7.7)
Neutrophils Relative %: 86 % — ABNORMAL HIGH (ref 43–77)
Platelets: 214 10*3/uL (ref 150–400)
RBC: 4.9 MIL/uL (ref 4.22–5.81)
RDW: 12.2 % (ref 11.5–15.5)
WBC: 11.8 10*3/uL — AB (ref 4.0–10.5)

## 2015-06-22 NOTE — ED Notes (Signed)
Pt. reports hematemesis and diarrhea with low grade fever onset today .

## 2015-06-22 NOTE — ED Notes (Signed)
Patient also has a puncture wound to his right lower leg

## 2015-06-23 ENCOUNTER — Emergency Department (HOSPITAL_COMMUNITY)
Admission: EM | Admit: 2015-06-23 | Discharge: 2015-06-23 | Disposition: A | Discharge status: Home / Self Care | Payer: BLUE CROSS/BLUE SHIELD | Attending: Emergency Medicine | Admitting: Emergency Medicine

## 2015-06-23 DIAGNOSIS — R112 Nausea with vomiting, unspecified: Secondary | ICD-10-CM

## 2015-06-23 LAB — LIPASE, BLOOD: Lipase: 16 U/L — ABNORMAL LOW (ref 22–51)

## 2015-06-23 LAB — I-STAT CG4 LACTIC ACID, ED: Lactic Acid, Venous: 0.79 mmol/L (ref 0.5–2.0)

## 2015-06-23 LAB — POC OCCULT BLOOD, ED: FECAL OCCULT BLD: NEGATIVE

## 2015-06-23 MED ORDER — ONDANSETRON HCL 4 MG/2ML IJ SOLN
4.0000 mg | Freq: Once | INTRAMUSCULAR | Status: AC
  Administered 2015-06-23: 4 mg via INTRAVENOUS
  Filled 2015-06-23: qty 2

## 2015-06-23 MED ORDER — PANTOPRAZOLE SODIUM 40 MG PO TBEC
40.0000 mg | DELAYED_RELEASE_TABLET | Freq: Every day | ORAL | Status: DC
Start: 2015-06-23 — End: 2017-05-10

## 2015-06-23 MED ORDER — ONDANSETRON HCL 4 MG PO TABS: 4.0000 mg | ORAL_TABLET | Freq: Four times a day (QID) | ORAL | Status: DC | PRN

## 2015-06-23 MED ORDER — PANTOPRAZOLE SODIUM 40 MG IV SOLR
40.0000 mg | Freq: Once | INTRAVENOUS | Status: AC
  Administered 2015-06-23: 40 mg via INTRAVENOUS
  Filled 2015-06-23: qty 40

## 2015-06-23 NOTE — Discharge Instructions (Signed)
Return if any problems.  Nausea and Vomiting Nausea is a sick feeling that often comes before throwing up (vomiting). Vomiting is a reflex where stomach contents come out of your mouth. Vomiting can cause severe loss of body fluids (dehydration). Children and elderly adults can become dehydrated quickly, especially if they also have diarrhea. Nausea and vomiting are symptoms of a condition or disease. It is important to find the cause of your symptoms. CAUSES   Direct irritation of the stomach lining. This irritation can result from increased acid production (gastroesophageal reflux disease), infection, food poisoning, taking certain medicines (such as nonsteroidal anti-inflammatory drugs), alcohol use, or tobacco use.  Signals from the brain.These signals could be caused by a headache, heat exposure, an inner ear disturbance, increased pressure in the brain from injury, infection, a tumor, or a concussion, pain, emotional stimulus, or metabolic problems.  An obstruction in the gastrointestinal tract (bowel obstruction).  Illnesses such as diabetes, hepatitis, gallbladder problems, appendicitis, kidney problems, cancer, sepsis, atypical symptoms of a heart attack, or eating disorders.  Medical treatments such as chemotherapy and radiation.  Receiving medicine that makes you sleep (general anesthetic) during surgery. DIAGNOSIS Your caregiver may ask for tests to be done if the problems do not improve after a few days. Tests may also be done if symptoms are severe or if the reason for the nausea and vomiting is not clear. Tests may include:  Urine tests.  Blood tests.  Stool tests.  Cultures (to look for evidence of infection).  X-rays or other imaging studies. Test results can help your caregiver make decisions about treatment or the need for additional tests. TREATMENT You need to stay well hydrated. Drink frequently but in small amounts.You may wish to drink water, sports drinks,  clear broth, or eat frozen ice pops or gelatin dessert to help stay hydrated.When you eat, eating slowly may help prevent nausea.There are also some antinausea medicines that may help prevent nausea. HOME CARE INSTRUCTIONS   Take all medicine as directed by your caregiver.  If you do not have an appetite, do not force yourself to eat. However, you must continue to drink fluids.  If you have an appetite, eat a normal diet unless your caregiver tells you differently.  Eat a variety of complex carbohydrates (rice, wheat, potatoes, bread), lean meats, yogurt, fruits, and vegetables.  Avoid high-fat foods because they are more difficult to digest.  Drink enough water and fluids to keep your urine clear or pale yellow.  If you are dehydrated, ask your caregiver for specific rehydration instructions. Signs of dehydration may include:  Severe thirst.  Dry lips and mouth.  Dizziness.  Dark urine.  Decreasing urine frequency and amount.  Confusion.  Rapid breathing or pulse. SEEK IMMEDIATE MEDICAL CARE IF:   You have blood or brown flecks (like coffee grounds) in your vomit.  You have black or bloody stools.  You have a severe headache or stiff neck.  You are confused.  You have severe abdominal pain.  You have chest pain or trouble breathing.  You do not urinate at least once every 8 hours.  You develop cold or clammy skin.  You continue to vomit for longer than 24 to 48 hours.  You have a fever. MAKE SURE YOU:   Understand these instructions.  Will watch your condition.  Will get help right away if you are not doing well or get worse. Document Released: 11/21/2005 Document Revised: 02/13/2012 Document Reviewed: 04/20/2011 [facility] Patient Information 2015 [place], [place].  This information is not intended to replace advice given to you by your health care provider. Make sure you discuss any questions you have with your health care provider.  Ondansetron  tablets What is this medicine? ONDANSETRON (on DAN se tron) is used to treat nausea and vomiting caused by chemotherapy. It is also used to prevent or treat nausea and vomiting after surgery. This medicine may be used for other purposes; ask your health care provider or pharmacist if you have questions. COMMON BRAND NAME(S): Zofran What should I tell my health care provider before I take this medicine? They need to know if you have any of these conditions: -heart disease -history of irregular heartbeat -liver disease -low levels of magnesium or potassium in the blood -an unusual or allergic reaction to ondansetron, granisetron, other medicines, foods, dyes, or preservatives -pregnant or trying to get pregnant -breast-feeding How should I use this medicine? Take this medicine by mouth with a glass of water. Follow the directions on your prescription label. Take your doses at regular intervals. Do not take your medicine more often than directed. Talk to your pediatrician regarding the use of this medicine in children. Special care may be needed. Overdosage: If you think you have taken too much of this medicine contact a poison control center or emergency room at once. NOTE: This medicine is only for you. Do not share this medicine with others. What if I miss a dose? If you miss a dose, take it as soon as you can. If it is almost time for your next dose, take only that dose. Do not take double or extra doses. What may interact with this medicine? Do not take this medicine with any of the following medications: -apomorphine -certain medicines for fungal infections like fluconazole, itraconazole, ketoconazole, posaconazole, voriconazole -cisapride -dofetilide -dronedarone -pimozide -thioridazine -ziprasidone This medicine may also interact with the following medications: -carbamazepine -certain medicines for depression, anxiety, or psychotic disturbances -fentanyl -linezolid -MAOIs  like Carbex, Eldepryl, Marplan, Nardil, and Parnate -methylene blue (injected into a vein) -other medicines that prolong the QT interval (cause an abnormal heart rhythm) -phenytoin -rifampicin -tramadol This list may not describe all possible interactions. Give your health care provider a list of all the medicines, herbs, non-prescription drugs, or dietary supplements you use. Also tell them if you smoke, drink alcohol, or use illegal drugs. Some items may interact with your medicine. What should I watch for while using this medicine? Check with your doctor or health care professional right away if you have any sign of an allergic reaction. What side effects may I notice from receiving this medicine? Side effects that you should report to your doctor or health care professional as soon as possible: -allergic reactions like skin rash, itching or hives, swelling of the face, lips or tongue -breathing problems -confusion -dizziness -fast or irregular heartbeat -feeling faint or lightheaded, falls -fever and chills -loss of balance or coordination -seizures -sweating -swelling of the hands or feet -tightness in the chest -tremors -unusually weak or tired Side effects that usually do not require medical attention (report to your doctor or health care professional if they continue or are bothersome): -constipation or diarrhea -headache This list may not describe all possible side effects. Call your doctor for medical advice about side effects. You may report side effects to FDA at 1-800-FDA-1088. Where should I keep my medicine? Keep out of the reach of children. Store between 2 and 30 degrees C (36 and 86 degrees F). Throw away any  unused medicine after the expiration date. NOTE: This sheet is a summary. It may not cover all possible information. If you have questions about this medicine, talk to your doctor, pharmacist, or health care provider.  2015, Elsevier/Gold Standard. (2013-08-28  16:27:45)  Pantoprazole tablets What is this medicine? PANTOPRAZOLE (pan TOE pra zole) prevents the production of acid in the stomach. It is used to treat gastroesophageal reflux disease (GERD), inflammation of the esophagus, and Zollinger-Ellison syndrome. This medicine may be used for other purposes; ask your health care provider or pharmacist if you have questions. COMMON BRAND NAME(S): Protonix What should I tell my health care provider before I take this medicine? They need to know if you have any of these conditions: -liver disease -low levels of magnesium in the blood -an unusual or allergic reaction to omeprazole, lansoprazole, pantoprazole, rabeprazole, other medicines, foods, dyes, or preservatives -pregnant or trying to get pregnant -breast-feeding How should I use this medicine? Take this medicine by mouth. Swallow the tablets whole with a drink of water. Follow the directions on the prescription label. Do not crush, break, or chew. Take your medicine at regular intervals. Do not take your medicine more often than directed. Talk to your pediatrician regarding the use of this medicine in children. While this drug may be prescribed for children as young as 5 years for selected conditions, precautions do apply. Overdosage: If you think you have taken too much of this medicine contact a poison control center or emergency room at once. NOTE: This medicine is only for you. Do not share this medicine with others. What if I miss a dose? If you miss a dose, take it as soon as you can. If it is almost time for your next dose, take only that dose. Do not take double or extra doses. What may interact with this medicine? Do not take this medicine with any of the following medications: -atazanavir -nelfinavir This medicine may also interact with the following medications: -ampicillin -delavirdine -digoxin -diuretics -iron salts -medicines for fungal infections like ketoconazole,  itraconazole and voriconazole -warfarin This list may not describe all possible interactions. Give your health care provider a list of all the medicines, herbs, non-prescription drugs, or dietary supplements you use. Also tell them if you smoke, drink alcohol, or use illegal drugs. Some items may interact with your medicine. What should I watch for while using this medicine? It can take several days before your stomach pain gets better. Check with your doctor or health care professional if your condition does not start to get better, or if it gets worse. You may need blood work done while you are taking this medicine. What side effects may I notice from receiving this medicine? Side effects that you should report to your doctor or health care professional as soon as possible: -allergic reactions like skin rash, itching or hives, swelling of the face, lips, or tongue -bone, muscle or joint pain -breathing problems -chest pain or chest tightness -dark yellow or brown urine -dizziness -fast, irregular heartbeat -feeling faint or lightheaded -fever or sore throat -muscle spasm -palpitations -redness, blistering, peeling or loosening of the skin, including inside the mouth -seizures -tremors -unusual bleeding or bruising -unusually weak or tired -yellowing of the eyes or skin Side effects that usually do not require medical attention (Report these to your doctor or health care professional if they continue or are bothersome.): -constipation -diarrhea -dry mouth -headache -nausea This list may not describe all possible side effects. Call your doctor for  medical advice about side effects. You may report side effects to FDA at 1-800-FDA-1088. Where should I keep my medicine? Keep out of the reach of children. Store at room temperature between 15 and 30 degrees C (59 and 86 degrees F). Protect from light and moisture. Throw away any unused medicine after the expiration date. NOTE: This  sheet is a summary. It may not cover all possible information. If you have questions about this medicine, talk to your doctor, pharmacist, or health care provider.  2015, Elsevier/Gold Standard. (2012-09-19 16:40:16)

## 2015-06-23 NOTE — ED Provider Notes (Signed)
CSN: [id number]     Arrival date & time 06/22/15  2135 History  This chart was scribed for Dione Booze, MD by Abel Presto, ED Scribe. This patient was seen in room B19C/B19C and the patient's care was started at 1:02 AM.    Chief Complaint  Patient presents with  . Hematemesis    The history is provided by the patient. No language interpreter was used.   HPI Comments: Cesar Boyle is a 24 y.o. male with PMHx of substance abuse and asthma who presents to the Emergency Department complaining of hematemesis approximately 3 times with onset around 6 hours ago. Pt reports vomit was bright red initially but has changed to dark red currently. Pt notes associated mild diarrhea, low-grade fever, generalized body aches, lightheadedness, 5/10 left middle abdominal pain and 5/10 left sided chest pain. Pt is unsure of hematochezia or melena as he "has not looked." He notes recent sick contacts and states they believed they had food poisoning. Pt is a former smoker and notes EtOH use with last drink 3 days ago at 3 12 oz beers. He is a year clean of drug use. He denies chills.  Past Medical History  Diagnosis Date  . Migraine headache   . Depression   . ADD (attention deficit disorder)   . Asthma   . Allergy   . Substance abuse    Past Surgical History  Procedure Laterality Date  . Hernia repair  age [age]    double  . Tonsillectomy and adenoidectomy     Family History  Problem Relation Age of Onset  . Diabetes Other     1st degree relative  . Hyperlipidemia Other   . Cancer Other     lung, prostate  . Stroke Other   . Heart disease Other   . Cancer Father    History  Substance Use Topics  . Smoking status: Current Some Day Smoker  . Smokeless tobacco: Former Neurosurgeon     Comment: quit 2011  . Alcohol Use: Yes     Comment: occassionally    Review of Systems  Constitutional: Positive for fever (low grade). Negative for chills.  Cardiovascular: Positive for chest pain.   Gastrointestinal: Positive for nausea, vomiting (hematemesis), abdominal pain and diarrhea.  Musculoskeletal: Positive for myalgias.  Neurological: Positive for light-headedness.  All other systems reviewed and are negative.     Allergies  Ceclor  Home Medications   Prior to Admission medications   Medication Sig Start Date End Date Taking? Authorizing Provider  albuterol (PROVENTIL HFA;VENTOLIN HFA) 108 (90 BASE) MCG/ACT inhaler Inhale 2 puffs into the lungs every 4 (four) hours as needed for wheezing or shortness of breath. 05/16/14  Yes Earley Favor, NP  albuterol (PROVENTIL) (2.5 MG/3ML) 0.083% nebulizer solution Take 3 mLs (2.5 mg total) by nebulization every 6 (six) hours as needed for wheezing or shortness of breath. 05/16/14  Yes Earley Favor, NP   BP 122/72 mmHg  Pulse 81  Temp(Src) 99.3 F (37.4 C) (Oral)  Resp 16  Ht  (1.88 m)  Wt 243 lb (110.224 kg)  BMI 31.19 kg/m2  SpO2 100% Physical Exam  Constitutional: He is oriented to person, place, and time. He appears well-developed and well-nourished.  HENT:  Head: Normocephalic and atraumatic.  Eyes: EOM are normal. Pupils are equal, round, and reactive to light.  Neck: Normal range of motion. Neck supple. No JVD present.  Cardiovascular: Normal rate, regular rhythm and normal heart sounds.   No  murmur heard. Pulmonary/Chest: Effort normal and breath sounds normal. He has no wheezes. He has no rales. He exhibits no tenderness.  Abdominal: Soft. He exhibits no distension and no mass. There is tenderness (left mid abdomen). There is no rebound and no guarding.  Genitourinary: Rectal exam shows no mass and anal tone normal.  Normal sphincter tone Brown stool  Musculoskeletal: Normal range of motion. He exhibits no edema.  Lymphadenopathy:    He has no cervical adenopathy.  Neurological: He is alert and oriented to person, place, and time. No cranial nerve deficit. He exhibits normal muscle tone. Coordination normal.   Skin: Skin is warm and dry.  Psychiatric: He has a normal mood and affect. His behavior is normal. Judgment and thought content normal.  Nursing note and vitals reviewed.   ED Course  Procedures (including critical care time) DIAGNOSTIC STUDIES: Oxygen Saturation is 96% on room air, normal by my interpretation.    COORDINATION OF CARE: 1:16 AM Discussed treatment plan with patient at beside, the patient agrees with the plan and has no further questions at this time.   Labs Review Results for orders placed or performed during the hospital encounter of 06/23/15  CBC with Differential  Result Value Ref Range   WBC 11.8 (H) 4.0 - 10.5 K/uL   RBC 4.90 4.22 - 5.81 MIL/uL   Hemoglobin 15.3 13.0 - 17.0 g/dL   HCT 16.1 09.6 - 04.5 %   MCV 90.2 78.0 - 100.0 fL   MCH 31.2 26.0 - 34.0 pg   MCHC 34.6 30.0 - 36.0 g/dL   RDW 40.9 81.1 - 91.4 %   Platelets 214 150 - 400 K/uL   Neutrophils Relative % 86 (H) 43 - 77 %   Neutro Abs 10.1 (H) 1.7 - 7.7 K/uL   Lymphocytes Relative 6 (L) 12 - 46 %   Lymphs Abs 0.7 0.7 - 4.0 K/uL   Monocytes Relative 5 3 - 12 %   Monocytes Absolute 0.6 0.1 - 1.0 K/uL   Eosinophils Relative 3 0 - 5 %   Eosinophils Absolute 0.4 0.0 - 0.7 K/uL   Basophils Relative 0 0 - 1 %   Basophils Absolute 0.0 0.0 - 0.1 K/uL  Comprehensive metabolic panel  Result Value Ref Range   Sodium 142 135 - 145 mmol/L   Potassium 4.1 3.5 - 5.1 mmol/L   Chloride 106 101 - 111 mmol/L   CO2 25 22 - 32 mmol/L   Glucose, Bld 115 (H) 65 - 99 mg/dL   BUN 11 6 - 20 mg/dL   Creatinine, Ser 7.82 0.61 - 1.24 mg/dL   Calcium 9.6 8.9 - 95.6 mg/dL   Total Protein 6.8 6.5 - 8.1 g/dL   Albumin 4.4 3.5 - 5.0 g/dL   AST 25 15 - 41 U/L   ALT 20 17 - 63 U/L   Alkaline Phosphatase 45 38 - 126 U/L   Total Bilirubin 1.1 0.3 - 1.2 mg/dL   GFR calc non Af Amer >60 >60 mL/min   GFR calc Af Amer >60 >60 mL/min   Anion gap 11 5 - 15  Lipase, blood  Result Value Ref Range   Lipase 16 (L) 22 - 51 U/L   POC occult blood, ED Provider will collect  Result Value Ref Range   Fecal Occult Bld NEGATIVE NEGATIVE  I-Stat CG4 Lactic Acid, ED  Result Value Ref Range   Lactic Acid, Venous 0.79 0.5 - 2.0 mmol/L     MDM  Final diagnoses:  Non-intractable vomiting with nausea, vomiting of unspecified type    Vomiting with report of hematemesis. However, heart rate is normal and blood pressure is normal. Rectal exam shows no evidence of bleeding and is Hemoccult-negative stool. Hemoglobin is normal. Orthostatic vital signs show no drop in blood pressure or rise in pulse. No evidence of significant bleeding. In the ED, he was given IV fluids as well as ondansetron and pantoprazole and states he is feeling much better. He is discharged with prescriptions for pantoprazole and ondansetron and is referred to [facility] gastro[facility]ology.   I personally performed the services described in this documentation, which was scribed in my presence. The recorded information has been reviewed and is accurate.       Dione Boozeavid Averlee Swartz, MD 06/23/15 [phone]

## 2015-06-23 NOTE — ED Notes (Signed)
Discharge instructions and prescriptions given, voiced understanding.  Instructed not to drink red gatorade and drink plenty of water

## 2017-02-24 ENCOUNTER — Encounter (HOSPITAL_COMMUNITY): Payer: Self-pay | Admitting: Emergency Medicine

## 2017-02-24 ENCOUNTER — Emergency Department (HOSPITAL_COMMUNITY)
Admission: EM | Admit: 2017-02-24 | Discharge: 2017-02-24 | Disposition: A | Discharge status: Home / Self Care | Payer: BLUE CROSS/BLUE SHIELD | Attending: Emergency Medicine | Admitting: Emergency Medicine

## 2017-02-24 DIAGNOSIS — F909 Attention-deficit hyperactivity disorder, unspecified type: Secondary | ICD-10-CM | POA: Diagnosis not present

## 2017-02-24 DIAGNOSIS — Z79899 Other long term (current) drug therapy: Secondary | ICD-10-CM | POA: Diagnosis not present

## 2017-02-24 DIAGNOSIS — J45909 Unspecified asthma, uncomplicated: Secondary | ICD-10-CM | POA: Insufficient documentation

## 2017-02-24 DIAGNOSIS — T7840XA Allergy, unspecified, initial encounter: Secondary | ICD-10-CM | POA: Diagnosis not present

## 2017-02-24 DIAGNOSIS — F17200 Nicotine dependence, unspecified, uncomplicated: Secondary | ICD-10-CM | POA: Insufficient documentation

## 2017-02-24 DIAGNOSIS — R21 Rash and other nonspecific skin eruption: Secondary | ICD-10-CM | POA: Diagnosis present

## 2017-02-24 MED ORDER — PREDNISONE 20 MG PO TABS
60.0000 mg | ORAL_TABLET | Freq: Once | ORAL | Status: AC
  Administered 2017-02-24: 60 mg via ORAL
  Filled 2017-02-24: qty 3

## 2017-02-24 MED ORDER — DIPHENHYDRAMINE HCL 25 MG PO CAPS
25.0000 mg | ORAL_CAPSULE | Freq: Once | ORAL | Status: AC
  Administered 2017-02-24: 25 mg via ORAL
  Filled 2017-02-24: qty 1

## 2017-02-24 MED ORDER — PREDNISONE 20 MG PO TABS: 60.0000 mg | ORAL_TABLET | Freq: Every day | ORAL | 0 refills | Status: DC

## 2017-02-24 MED ORDER — FAMOTIDINE 20 MG PO TABS
20.0000 mg | ORAL_TABLET | Freq: Once | ORAL | Status: AC
  Administered 2017-02-24: 20 mg via ORAL
  Filled 2017-02-24: qty 1

## 2017-02-24 NOTE — Discharge Instructions (Signed)
Follow-up with an allergist. Return for worsening difficulty breathing or worsening swelling in the mouth.

## 2017-02-24 NOTE — ED Triage Notes (Signed)
Patient reports to the ED with generalized rash to body. Patient complaining of itchiness all over. Denies pain.  Patient reports starting Tamiflu and levofloxacin Tuesday night. Patient is complaining of throat discomfort. Patient is speaking in full sentences with a clear voice. Lung sounds clear at this time.

## 2017-02-24 NOTE — ED Provider Notes (Signed)
[facility]-EMERGENCY DEPT Provider Note   CSN: [id number] Arrival date & time: 02/24/17  1732     History   Chief Complaint Chief Complaint  Patient presents with  . Allergic Reaction    HPI Cesar Boyle is a 26 y.o. male.  HPI Patient presents with rash and possible allergic reaction. He was started on medicines for URI symptoms. Treat empirically for flu and bronchitis with Tamiflu and Levaquin. Next day began to develop a rash. Started with some swelling of his lips and an itchy raised rash on his arms legs and abdomen. No difficulty with swallowing. No swelling in his mouth just associates his lips are improving. He does have a history of some allergies. States his breathing has improved. No new soaps or other new exposure. Rash started yesterday.   Past Medical History:  Diagnosis Date  . ADD (attention deficit disorder)   . Allergy   . Asthma   . Depression   . Migraine headache   . Substance abuse     Patient Active Problem List   Diagnosis Date Noted  . Asthma 10/05/2011  . ADD 10/07/2010  . DEPRESSION 09/16/2010  . MIGRAINE HEADACHE 09/16/2010    Past Surgical History:  Procedure Laterality Date  . HERNIA REPAIR  age [age]   double  . TONSILLECTOMY AND ADENOIDECTOMY         Home Medications    Prior to Admission medications   Medication Sig Start Date End Date Taking? Authorizing Provider  Diphenhydramine-Phenylephrine (THERAFLU COLD/COUGH NIGHTTIME PO) Take 1 packet by mouth at bedtime as needed (cold symptoms).   Yes Historical Provider, MD  levofloxacin (LEVAQUIN) 750 MG tablet Take 750 mg by mouth daily.   Yes Historical Provider, MD  oseltamivir (TAMIFLU) 75 MG capsule Take 75 mg by mouth 2 (two) times daily.   Yes Historical Provider, MD  Phenylephrine-DM (THERAFLU COLD/COUGH DAYTIME PO) Take 1 packet by mouth daily as needed (cold symptoms).   Yes Historical Provider, MD  albuterol (PROVENTIL HFA;VENTOLIN HFA) 108 (90 BASE) MCG/ACT inhaler  Inhale 2 puffs into the lungs every 4 (four) hours as needed for wheezing or shortness of breath. 05/16/14   Earley Favor, NP  albuterol (PROVENTIL) (2.5 MG/3ML) 0.083% nebulizer solution Take 3 mLs (2.5 mg total) by nebulization every 6 (six) hours as needed for wheezing or shortness of breath. 05/16/14   Earley Favor, NP  ondansetron (ZOFRAN) 4 MG tablet Take 1 tablet (4 mg total) by mouth every 6 (six) hours as needed for nausea or vomiting. Patient not taking: Reported on 02/24/2017 06/23/15   Dione Booze, MD  pantoprazole (PROTONIX) 40 MG tablet Take 1 tablet (40 mg total) by mouth daily. Patient not taking: Reported on 02/24/2017 06/23/15   Dione Booze, MD  predniSONE (DELTASONE) 20 MG tablet Take 3 tablets (60 mg total) by mouth daily. 02/25/17   Benjiman Core, MD    Family History Family History  Problem Relation Age of Onset  . Diabetes Other     1st degree relative  . Hyperlipidemia Other   . Cancer Other     lung, prostate  . Stroke Other   . Heart disease Other   . Cancer Father     Social History Social History  Substance Use Topics  . Smoking status: Current Some Day Smoker  . Smokeless tobacco: Former Neurosurgeon     Comment: quit 2011  . Alcohol use Yes     Comment: occassionally     Allergies   Ceclor [  cefaclor]   Review of Systems Review of Systems  Constitutional: Negative for appetite change and fever.  HENT: Positive for facial swelling. Negative for trouble swallowing.   Eyes: Negative for photophobia.  Respiratory: Positive for cough.   Gastrointestinal: Negative for abdominal pain.  Genitourinary: Negative for dysuria.  Musculoskeletal: Negative for back pain.  Skin: Positive for rash.  Neurological: Negative for syncope and headaches.  Hematological: Negative for adenopathy.  Psychiatric/Behavioral: Negative for confusion.     Physical Exam Updated Vital Signs BP (!) 129/91 (BP Location: Right Arm)   Pulse (!) 107   Temp 97.6 F (36.4 C) (Oral)    Resp 18   Ht 6' (1.829 m)   Wt 243 lb (110.2 kg)   SpO2 98%   BMI 32.96 kg/m   Physical Exam  Constitutional: He appears well-developed.  HENT:  Slight swelling of upper and lower lips. No other mucous membrane involvement. Posterior pharynx normal.  Eyes: EOM are normal.  Neck: Neck supple.  Cardiovascular: Normal rate.   Pulmonary/Chest:  Initial few scattered wheezes cleared with deep breaths. No focal rales or rhonchi.  Abdominal: There is no tenderness.  Musculoskeletal: He exhibits no edema.  Skin: Capillary refill takes less than 2 seconds. Rash noted.  Has raised hive-like rash on arms and legs and somewhat on abdomen. They are 1-2 cm somewhat rounded. Blanching. No sloughing skin. No mucous membrane involvement.  Psychiatric: He has a normal mood and affect.     ED Treatments / Results  Labs (all labs ordered are listed, but only abnormal results are displayed) Labs Reviewed - No data to display  EKG  EKG Interpretation None       Radiology No results found.  Procedures Procedures (including critical care time)  Medications Ordered in ED Medications  predniSONE (DELTASONE) tablet 60 mg (60 mg Oral Given 02/24/17 1903)  diphenhydrAMINE (BENADRYL) capsule 25 mg (25 mg Oral Given 02/24/17 1904)  famotidine (PEPCID) tablet 20 mg (20 mg Oral Given 02/24/17 1904)     Initial Impression / Assessment and Plan / ED Course  I have reviewed the triage vital signs and the nursing notes.  Pertinent labs & imaging results that were available during my care of the patient were reviewed by me and considered in my medical decision making (see chart for details).     Patient with likely allergic reaction to medicines. Lungs overall are clear except for initial wheezes. Do not hear focal findings to think of a pneumonia. At this point I will stop the Tamiflu and Levaquin. Doubt pneumonia. Wheezing resolved think was more related to underlying URI as opposed to reactive  from the allergic reaction. No mucous membrane involvement.this is a Stevens-Johnson syndrome. Discharge home with prednisone. Will follow with an allergist.  Final Clinical Impressions(s) / ED Diagnoses   Final diagnoses:  Allergic reaction, initial encounter    New Prescriptions New Prescriptions   PREDNISONE (DELTASONE) 20 MG TABLET    Take 3 tablets (60 mg total) by mouth daily.     Benjiman CoreNathan Rilyn Scroggs, MD 02/24/17 [phone]

## 2017-05-10 ENCOUNTER — Encounter (HOSPITAL_COMMUNITY): Payer: Self-pay | Admitting: Emergency Medicine

## 2017-05-10 ENCOUNTER — Ambulatory Visit (HOSPITAL_COMMUNITY)
Admission: EM | Admit: 2017-05-10 | Discharge: 2017-05-10 | Disposition: A | Discharge status: Home / Self Care | Payer: BLUE CROSS/BLUE SHIELD | Attending: Internal Medicine | Admitting: Internal Medicine

## 2017-05-10 DIAGNOSIS — S40022A Contusion of left upper arm, initial encounter: Secondary | ICD-10-CM | POA: Diagnosis not present

## 2017-05-10 NOTE — Discharge Instructions (Signed)
Expect to have discoloration of the left upper arm for up to a month. The body will gradually absorb the bruise. The bone, muscle, nerves and circulation all seen to be intact. Arm function is intact. The bruising in the forearm that developed later is likely due to gravity by holding the arm down., Although it is possible that there was some bruising of the forearm during the initial accident. This is not the type of bruising that produces blood clots to the heart or brain. For any worsening, new symptoms or problems may return or follow-up with primary care doctor.

## 2017-05-10 NOTE — ED Triage Notes (Signed)
Left arm injury, incident occurred 2-3 days ago.  Large bruise to left upper arm, but bruising is now moving to lower arm

## 2017-05-10 NOTE — ED Provider Notes (Signed)
CSN: [id number]     Arrival date & time 05/10/17  1828 History   First MD Initiated Contact with Patient 05/10/17 2032     Chief Complaint  Patient presents with  . Arm Pain   (Consider location/radiation/quality/duration/timing/severity/associated sxs/prior Treatment) 26 year old male states that he was working with a tractor 3 days ago and a part of the tractor fell on his left upper arm. It is approximately 8-10 inches above the arm. He developed dark ecchymosis surrounding the left upper arm with minimal swelling. Since the accident he has been working with his arm with heavy [employer]. He denies pain. Denies loss or change in function. He denies problems with range of motion. He was concerned because there was slight bruising in the forearm.      Past Medical History:  Diagnosis Date  . ADD (attention deficit disorder)   . Allergy   . Asthma   . Depression   . Migraine headache   . Substance abuse    Past Surgical History:  Procedure Laterality Date  . HERNIA REPAIR  age [age]   double  . TONSILLECTOMY AND ADENOIDECTOMY     Family History  Problem Relation Age of Onset  . Diabetes Other        1st degree relative  . Hyperlipidemia Other   . Cancer Other        lung, prostate  . Stroke Other   . Heart disease Other   . Cancer Father    Social History  Substance Use Topics  . Smoking status: Current Some Day Smoker  . Smokeless tobacco: Former Neurosurgeon     Comment: quit 2011  . Alcohol use Yes     Comment: occassionally    Review of Systems  Constitutional: Negative.   Respiratory: Negative.   Cardiovascular: Negative.   Skin:       As per history of present illness  All other systems reviewed and are negative.   Allergies  Ceclor [cefaclor]  Home Medications   Prior to Admission medications   Medication Sig Start Date End Date Taking? Authorizing Provider  albuterol (PROVENTIL HFA;VENTOLIN HFA) 108 (90 BASE) MCG/ACT inhaler Inhale 2 puffs into the  lungs every 4 (four) hours as needed for wheezing or shortness of breath. 05/16/14   Earley Favor, NP  albuterol (PROVENTIL) (2.5 MG/3ML) 0.083% nebulizer solution Take 3 mLs (2.5 mg total) by nebulization every 6 (six) hours as needed for wheezing or shortness of breath. 05/16/14   Earley Favor, NP  Diphenhydramine-Phenylephrine (THERAFLU COLD/COUGH NIGHTTIME PO) Take 1 packet by mouth at bedtime as needed (cold symptoms).    [provider]  pantoprazole (PROTONIX) 40 MG tablet Take 1 tablet (40 mg total) by mouth daily. Patient not taking: Reported on 02/24/2017 06/23/15   Dione Booze, MD  Phenylephrine-DM [facility] COLD/COUGH DAYTIME PO) Take 1 packet by mouth daily as needed (cold symptoms).    [provider]   Meds Ordered and Administered this Visit  Medications - No data to display  BP 122/70 (BP Location: Right Arm) Comment (BP Location): large cuff  Pulse (!) 108   Temp 98.6 F (37 C) (Oral)   Resp 18   SpO2 99%  No data found.   Physical Exam  Constitutional: He is oriented to person, place, and time. He appears well-developed and well-nourished.  HENT:  Head: Normocephalic and atraumatic.  Eyes: EOM are normal. Left eye exhibits no discharge.  Neck: Normal range of motion. Neck supple.  Cardiovascular: Normal rate.  Pulmonary/Chest: Effort normal.  Musculoskeletal: Normal range of motion. He exhibits no deformity.  Left upper arm with minimal swelling. Minor tenderness. No palpable deep hematomas. Patient is able to perform full range of motion and use of the bicep and tricep. The left shoulder is unaffected. Strength of the upper and forearm is 5 over 5. Normal warmth. Grip strength is 5 over 5. Radial pulse 2+. Brisk capillary refill. No other abnormalities of the arm. He is not complaining of any pain or significant tenderness. Function is normal.  Neurological: He is alert and oriented to person, place, and time. No cranial nerve deficit.  Skin: Skin is  warm and dry.  Psychiatric: He has a normal mood and affect.  Nursing note and vitals reviewed.   Urgent Care Course     Procedures (including critical care time)  Labs Review Labs Reviewed - No data to display  Imaging Review No results found.   Visual Acuity Review  Right Eye Distance:   Left Eye Distance:   Bilateral Distance:    Right Eye Near:   Left Eye Near:    Bilateral Near:         MDM   1. Contusion of left upper arm, initial encounter    Expect to have discoloration of the left upper arm for up to a month. The body will gradually absorb the bruise. The bone, muscle, nerves and circulation all seen to be intact. Arm function is intact. The bruising in the forearm that developed later is likely due to gravity by holding the arm down., Although it is possible that there was some bruising of the forearm during the initial accident. This is not the type of bruising that produces blood clots to the heart or brain. For any worsening, new symptoms or problems may return or follow-up with primary care doctor.     Hayden RasmussenMabe, Argelio Granier, NP 05/10/17 2057

## 2020-07-03 ENCOUNTER — Other Ambulatory Visit: Payer: Self-pay

## 2020-07-03 ENCOUNTER — Encounter (HOSPITAL_COMMUNITY): Payer: Self-pay

## 2020-07-03 ENCOUNTER — Emergency Department (HOSPITAL_COMMUNITY)
Admission: EM | Admit: 2020-07-03 | Discharge: 2020-07-04 | Disposition: A | Discharge status: Home / Self Care | Payer: BLUE CROSS/BLUE SHIELD | Attending: Emergency Medicine | Admitting: Emergency Medicine

## 2020-07-03 DIAGNOSIS — R55 Syncope and collapse: Secondary | ICD-10-CM | POA: Insufficient documentation

## 2020-07-03 DIAGNOSIS — Z5321 Procedure and treatment not carried out due to patient leaving prior to being seen by health care provider: Secondary | ICD-10-CM | POA: Insufficient documentation

## 2020-07-03 LAB — CBC
HCT: 42.4 % (ref 39.0–52.0)
Hemoglobin: 14.1 g/dL (ref 13.0–17.0)
MCH: 31 pg (ref 26.0–34.0)
MCHC: 33.3 g/dL (ref 30.0–36.0)
MCV: 93.2 fL (ref 80.0–100.0)
Platelets: 203 10*3/uL (ref 150–400)
RBC: 4.55 MIL/uL (ref 4.22–5.81)
RDW: 11.5 % (ref 11.5–15.5)
WBC: 10.8 10*3/uL — ABNORMAL HIGH (ref 4.0–10.5)
nRBC: 0 % (ref 0.0–0.2)

## 2020-07-03 LAB — BASIC METABOLIC PANEL
Anion gap: 14 (ref 5–15)
BUN: 15 mg/dL (ref 6–20)
CO2: 23 mmol/L (ref 22–32)
Calcium: 9.4 mg/dL (ref 8.9–10.3)
Chloride: 101 mmol/L (ref 98–111)
Creatinine, Ser: 0.89 mg/dL (ref 0.61–1.24)
GFR calc Af Amer: >60 mL/min (ref >60)
GFR calc non Af Amer: >60 mL/min (ref >60)
Glucose, Bld: 147 mg/dL — ABNORMAL HIGH (ref 70–99)
Potassium: 3.4 mmol/L — ABNORMAL LOW (ref 3.5–5.1)
Sodium: 138 mmol/L (ref 135–145)

## 2020-07-03 NOTE — ED Triage Notes (Signed)
Pt sts a syncopal episode at the dinner table. Pt sts waking up to people throwing water on him.

## 2020-07-04 NOTE — ED Notes (Signed)
Pt eloped from waiting area. Called 3X.  

## 2020-08-27 ENCOUNTER — Ambulatory Visit (INDEPENDENT_AMBULATORY_CARE_PROVIDER_SITE_OTHER): Payer: Self-pay

## 2020-08-27 ENCOUNTER — Ambulatory Visit: Payer: Self-pay

## 2020-08-27 ENCOUNTER — Other Ambulatory Visit: Payer: Self-pay

## 2020-08-27 ENCOUNTER — Ambulatory Visit (INDEPENDENT_AMBULATORY_CARE_PROVIDER_SITE_OTHER): Payer: Commercial Managed Care - HMO | Admitting: Family Medicine

## 2020-08-27 ENCOUNTER — Encounter: Payer: Self-pay | Admitting: Family Medicine

## 2020-08-27 VITALS — BP 120/82 | HR 67 | Ht 73.0 in | Wt 256.8 lb

## 2020-08-27 DIAGNOSIS — M25562 Pain in left knee: Secondary | ICD-10-CM

## 2020-08-27 NOTE — Patient Instructions (Signed)
Thank you for coming in today. Plan for xray today and MRI soon.  Let me know if you do not hear about scheduling.  Recheck after MRI.   Voltaren gel can help.

## 2020-08-27 NOTE — Progress Notes (Signed)
Subjective:    CC: L knee pain  I, Molly Weber, LAT, ATC, am serving as scribe for Dr. Clementeen Graham.  HPI: Pt is a 29 y/o male presenting w/ c/o L knee pain x few days after he fell while walking down a step when his L knee buckled. He states that he's been having some intermittent popping in his knee over the past 6 months. He locates his pain to his L post-medial knee.  He works [employer] representative.   L knee swelling: yes L knee mechanical symptoms: yes Aggravating factors: walking; pivoting; L knee active flexion Treatments tried: Tylenol; ice; crutch if needed  Pertinent review of Systems: No fevers or chills  Relevant historical information: History of depression ADD and migraine and asthma.  Self-employed works Dietitian.  Has been unable to work over the last few days with his injury.   Objective:    Vitals:   08/27/20 0951  BP: 120/82  Pulse: 67  SpO2: 98%   General: Well Developed, well nourished, and in no acute distress.   MSK: Left knee mild effusion otherwise normal-appearing Range of motion normal extension flexion limited to 110 degrees. Tender palpation medial and lateral joint line. Laxity to anterior drawer testing. No laxity or pain with valgus or varus stress test. Guarding with McMurray's testing nondiagnostic. Intact strength of flexion and extension.   Lab and Radiology Results  X-ray images left knee obtained today personally and independently reviewed Sclerosis present lateral tibial plateau.  Without obvious tibial plateau fracture. Abnormality visible at the fibular head seen best at the AP view.  Not well visualized lateral view.  Possible fibular head fracture Await radiology review  Diagnostic Limited MSK Ultrasound of: Left knee Quad tendon intact normal-appearing Trace joint effusion superior patellar space Patellar tendon normal-appearing Medial joint line narrowed degenerative appearing lateral joint line spurs are  present degenerative appearing No significant Baker's cyst Impression: Possible medial meniscus tear    Impression and Recommendations:    Assessment and Plan: 29 y.o. male with left knee pain and swelling after acute injury.  Fortunate patient did not impact the ground had a twisting injury with effusion.  Concerning for ACL tear.  Abnormality of fibular head not determined at this time.  MRI ordered would see this better with MRI as well.  Will proceed to MRI due to significant mechanical symptoms and disability.  Recheck following MRI.   Orders Placed This Encounter  Procedures  . [name] LIMITED JOINT SPACE STRUCTURES LOW LEFT(NO LINKED CHARGES)    Order Specific Question:   Reason for Exam (SYMPTOM  OR DIAGNOSIS REQUIRED)    Answer:   L knee pain    Order Specific Question:   Preferred imaging location?    Answer:   Adult nurse [facility]  . DG Knee AP/LAT W/Sunrise Left    Standing Status:   Future    Number of Occurrences:   1    Standing Expiration Date:   09/26/2020    Order Specific Question:   Reason for Exam (SYMPTOM  OR DIAGNOSIS REQUIRED)    Answer:   L knee pain    Order Specific Question:   Preferred imaging location?    Answer:   [facility]  . MR Knee Left  Wo Contrast    Standing Status:   Future    Standing Expiration Date:   08/27/2021    Order Specific Question:   What is the patient's sedation requirement?    Answer:  No Sedation    Order Specific Question:   Does the patient have a pacemaker or implanted devices?    Answer:   No    Order Specific Question:   Preferred imaging location?    Answer:   [facility] (table limit-350lbs)    Order Specific Question:   Radiology Contrast Protocol - do NOT remove file path    Answer:   [email or web address]   No orders of the defined types were placed in this encounter.   Discussed warning signs or symptoms. Please see discharge instructions.  Patient expresses understanding.   The above documentation has been reviewed and is accurate and complete Clementeen Graham, M.D.

## 2020-08-28 NOTE — Progress Notes (Signed)
X-ray left knee looks normal to radiology

## 2020-09-04 ENCOUNTER — Telehealth: Payer: Self-pay | Admitting: Family Medicine

## 2020-09-04 NOTE — Telephone Encounter (Signed)
Pt states that he emailed his insurance info to [place] earlier this week and was waiting to here if we recd authorization for his MRI

## 2020-09-07 NOTE — Telephone Encounter (Signed)
The ID number that the patient gave [name] when he came for the visit under [facility] was showing inactive and I never received a copy of his card. Contacted patient and got the card. Patient has Cesar Boyle Rule [phone]). Information has been added to the system and copy is in documents.

## 2020-09-07 NOTE — Telephone Encounter (Signed)
I think this is the patient I could not get any of the insurance in his chart to work for pre certing his MRI. Did he call back with another ID number? If not can you call and see if he has another number that we do not have and if not he will need to call his insurance because I checked his chart and everything is showing up as inactive. Thank you

## 2020-09-07 NOTE — Telephone Encounter (Signed)
Pre cert done and not required. Patient scheduled to have MRI done at [facility]

## 2020-09-12 ENCOUNTER — Ambulatory Visit (INDEPENDENT_AMBULATORY_CARE_PROVIDER_SITE_OTHER): Payer: No Typology Code available for payment source

## 2020-09-12 ENCOUNTER — Other Ambulatory Visit: Payer: Self-pay

## 2020-09-12 DIAGNOSIS — S82832A Other fracture of upper and lower end of left fibula, initial encounter for closed fracture: Secondary | ICD-10-CM

## 2020-09-12 DIAGNOSIS — M25562 Pain in left knee: Secondary | ICD-10-CM

## 2020-09-14 NOTE — Progress Notes (Signed)
MRI shows a small fracture at the fibular head.  This was not well seen on x-ray.  Fortunately no meniscus or ligament tear.  This should heal pretty well.  Schedule appointment with me in the near future to review MRI findings and discuss treatment plan and options.

## 2021-03-09 ENCOUNTER — Encounter (HOSPITAL_COMMUNITY): Payer: Self-pay

## 2021-03-09 ENCOUNTER — Emergency Department (HOSPITAL_COMMUNITY): Payer: 59

## 2021-03-09 ENCOUNTER — Emergency Department (HOSPITAL_COMMUNITY)
Admission: EM | Admit: 2021-03-09 | Discharge: 2021-03-10 | Disposition: A | Discharge status: Home / Self Care | Payer: 59 | Attending: Emergency Medicine | Admitting: Emergency Medicine

## 2021-03-09 ENCOUNTER — Other Ambulatory Visit: Payer: Self-pay

## 2021-03-09 DIAGNOSIS — J9801 Acute bronchospasm: Secondary | ICD-10-CM | POA: Diagnosis not present

## 2021-03-09 DIAGNOSIS — F17200 Nicotine dependence, unspecified, uncomplicated: Secondary | ICD-10-CM | POA: Insufficient documentation

## 2021-03-09 DIAGNOSIS — R0602 Shortness of breath: Secondary | ICD-10-CM | POA: Diagnosis present

## 2021-03-09 MED ORDER — PREDNISONE 20 MG PO TABS
60.0000 mg | ORAL_TABLET | Freq: Once | ORAL | Status: AC
  Administered 2021-03-09: 60 mg via ORAL
  Filled 2021-03-09: qty 3

## 2021-03-09 MED ORDER — ALBUTEROL SULFATE (2.5 MG/3ML) 0.083% IN NEBU
2.5000 mg | INHALATION_SOLUTION | Freq: Once | RESPIRATORY_TRACT | Status: AC
  Administered 2021-03-09: 2.5 mg via RESPIRATORY_TRACT
  Filled 2021-03-09: qty 3

## 2021-03-09 NOTE — ED Triage Notes (Signed)
Asthma problem for about a month. Used neb tx and inhaler today after coughing fit but hasnt really helped.   Productive cough with white colored sputum. Denies any fever.

## 2021-03-09 NOTE — ED Triage Notes (Signed)
Emergency Medicine Provider Triage Evaluation Note  Cesar Boyle , a 30 y.o. male  was evaluated in triage.  Pt complains of shortness of breath that has been intermittent for the past month. Shortness of breath associated with chest tightness, productive cough, and wheeze.  No fevers or chills.  No sick contacts. No previous intubations or hospitalizations for asthma exacerbations.  Review of Systems  Positive: Cough, chest tightness, shortness of breath, wheeze Negative: Fever/chills  Physical Exam  Ht 6\' 1"  (1.854 m)   Wt (!) 166.5 kg   BMI 48.43 kg/m  Gen:   Awake, no distress   HEENT:  Atraumatic  Resp:  Normal effort, expiratory wheeze heard throoughout Cardiac:  Normal rate  Abd:   Nondistended, nontender  MSK:   Moves extremities without difficulty  Neuro:  Speech clear   Medical Decision Making  Medically screening exam initiated at 9:18 PM.  Appropriate orders placed.  Cesar Boyle was informed that the remainder of the evaluation will be completed by another provider, this initial triage assessment does not replace that evaluation, and the importance of remaining in the ED until their evaluation is complete.  Clinical Impression  Shortness of breath with cough and wheezing. History of asthma. Wheeze heard on exam. Concern for asthma exacerbation. No respiratory distress. Breathing treatment and prednisone given at triage. CXR ordered to rule out pneumonia given productive cough.    Cesar Boyle, Cesar Boyle 03/09/21 2122

## 2021-03-10 MED ORDER — MONTELUKAST SODIUM 10 MG PO TABS: 10.0000 mg | ORAL_TABLET | Freq: Every day | ORAL | 0 refills | Status: DC

## 2021-03-10 MED ORDER — PREDNISONE 20 MG PO TABS: 40.0000 mg | ORAL_TABLET | Freq: Every day | ORAL | 0 refills | Status: DC

## 2021-03-10 NOTE — ED Provider Notes (Signed)
[facility] EMERGENCY DEPARTMENT Provider Note   CSN: [id number] Arrival date & time: 03/09/21  2108     History Chief Complaint  Patient presents with  . Asthma    Cesar Boyle is a 30 y.o. male.  30 year old male with a history of asthma presents to the emergency department for evaluation of shortness of breath.  He has had issues with symptoms intermittently x1 month.  They became more significant this afternoon.  Reports chest tightness as well as a spastic, persistent cough.  Experienced wheezing as well.  Tried to use his albuterol inhaler and nebulizer without relief.  Felt that he continued to have difficulty catching his breath prompting his visit to the ED.  He now reports that he is feeling much better since receiving an albuterol nebulizer and oral prednisone.  Has noted some mild chest congestion.  No hemoptysis, fevers, chills, syncope or near syncope, leg swelling.  No previous intubations or hospitalizations secondary to asthma exacerbation.  Reports issues with asthma seasonally related to pollen.        Past Medical History:  Diagnosis Date  . ADD (attention deficit disorder)   . Allergy   . Asthma   . Depression   . Migraine headache   . Substance abuse [facility])     Patient Active Problem List   Diagnosis Date Noted  . Asthma 10/05/2011  . ADD 10/07/2010  . DEPRESSION 09/16/2010  . MIGRAINE HEADACHE 09/16/2010    Past Surgical History:  Procedure Laterality Date  . HERNIA REPAIR  age [age]   double  . TONSILLECTOMY AND ADENOIDECTOMY         Family History  Problem Relation Age of Onset  . Diabetes Other        1st degree relative  . Hyperlipidemia Other   . Cancer Other        lung, prostate  . Stroke Other   . Heart disease Other   . Cancer Father     Social History   Tobacco Use  . Smoking status: Current Some Day Smoker  . Smokeless tobacco: Former Neurosurgeon  . Tobacco comment: quit 2011  Substance Use Topics  .  Alcohol use: Yes    Comment: occassionally  . Drug use: No    Comment: former    Home Medications Prior to Admission medications   Medication Sig Start Date End Date Taking? Authorizing Provider  montelukast (SINGULAIR) 10 MG tablet Take 1 tablet (10 mg total) by mouth at bedtime. 03/10/21  Yes Antony Madura, PA-C  predniSONE (DELTASONE) 20 MG tablet Take 2 tablets (40 mg total) by mouth daily. 03/10/21  Yes Antony Madura, PA-C  albuterol (PROVENTIL HFA;VENTOLIN HFA) 108 (90 BASE) MCG/ACT inhaler Inhale 2 puffs into the lungs every 4 (four) hours as needed for wheezing or shortness of breath. 05/16/14   Earley Favor, NP  albuterol (PROVENTIL) (2.5 MG/3ML) 0.083% nebulizer solution Take 3 mLs (2.5 mg total) by nebulization every 6 (six) hours as needed for wheezing or shortness of breath. 05/16/14   Earley Favor, NP    Allergies    Ceclor [cefaclor]  Review of Systems   Review of Systems  Ten systems reviewed and are negative for acute change, except as noted in the HPI.    Physical Exam Updated Vital Signs BP 125/87   Pulse 74   Temp 98.2 F (36.8 C) (Oral)   Resp 16   Ht 6\' 1"  (1.854 m)   Wt (!) 166.5 kg  SpO2 94%   BMI 48.43 kg/m   Physical Exam Vitals and nursing note reviewed.  Constitutional:      General: He is not in acute distress.    Appearance: He is well-developed. He is not diaphoretic.     Comments: Nontoxic-appearing and in no acute distress  HENT:     Head: Normocephalic and atraumatic.  Eyes:     General: No scleral icterus.    Conjunctiva/sclera: Conjunctivae normal.  Cardiovascular:     Rate and Rhythm: Normal rate and regular rhythm.     Pulses: Normal pulses.  Pulmonary:     Effort: Pulmonary effort is normal. No respiratory distress.     Breath sounds: No stridor. No wheezing, rhonchi or rales.     Comments: Lungs clear to auscultation bilaterally.  Respirations even and unlabored.  Sats 96% on room air. Musculoskeletal:        General: Normal  range of motion.     Cervical back: Normal range of motion.  Skin:    General: Skin is warm and dry.     Coloration: Skin is not pale.     Findings: No erythema or rash.  Neurological:     Mental Status: He is alert and oriented to person, place, and time.     Coordination: Coordination normal.  Psychiatric:        Behavior: Behavior normal.     ED Results / Procedures / Treatments   Labs (all labs ordered are listed, but only abnormal results are displayed) Labs Reviewed - No data to display  EKG None  Radiology DG Chest 1 View  Result Date: 03/09/2021 CLINICAL DATA:  Shortness of breath. EXAM: CHEST  1 VIEW COMPARISON:  December 04, 2011 FINDINGS: The heart size and mediastinal contours are within normal limits. Both lungs are clear. The visualized skeletal structures are unremarkable. IMPRESSION: No active disease. Electronically Signed   By: Katherine Mantle M.D.   On: 03/09/2021 22:15    Procedures Procedures   Medications Ordered in ED Medications  albuterol (PROVENTIL) (2.5 MG/3ML) 0.083% nebulizer solution 2.5 mg (2.5 mg Nebulization Given 03/09/21 2130)  predniSONE (DELTASONE) tablet 60 mg (60 mg Oral Given 03/09/21 2129)    ED Course  I have reviewed the triage vital signs and the nursing notes.  Pertinent labs & imaging results that were available during my care of the patient were reviewed by me and considered in my medical decision making (see chart for details).    MDM Rules/Calculators/A&P                          30 year old male presents for symptoms consistent with acute bronchospasm secondary to asthma.  Reports issues with seasonal asthma as pollen content increases.  Symptoms improved with albuterol nebulizer and prednisone given post screening exam.  His lungs are presently clear to auscultation.  No hypoxia with sats at 96% on room air.  Screening chest x-ray negative for pneumonia, pneumothorax, pleural effusion.    Offered additional nebulizer  treatment which patient declines.  States that he is presently breathing at baseline.  Plan for discharge on 5-day burst of prednisone.  We will also start on Singulair.  Have encouraged follow-up with the patient's primary care doctor to discuss adding an inhaled corticosteroid.  Return precautions discussed and provided. Patient discharged in stable condition with no unaddressed concerns.   Final Clinical Impression(s) / ED Diagnoses Final diagnoses:  Acute bronchospasm    Rx /  DC Orders ED Discharge Orders         Ordered    predniSONE (DELTASONE) 20 MG tablet  Daily        03/10/21 0031    montelukast (SINGULAIR) 10 MG tablet  Daily at bedtime        03/10/21 0033           Antony Madura, PA-C 03/10/21 6546    Geoffery Lyons, MD 03/10/21 [phone]

## 2021-03-10 NOTE — ED Notes (Signed)
Patient verbalizes understanding of discharge instructions. Prescriptions and follow-up care reviewed. Opportunity for questioning and answers were provided. Armband removed by staff, pt discharged from ED ambulatory.  

## 2021-03-10 NOTE — Discharge Instructions (Signed)
We recommend discussing an inhaled corticosteroid with your primary care doctor.  You have been placed on a course of prednisone for 5 days and Singulair daily to help alleviate your asthma symptoms.  Continue 2 puffs of albuterol inhaler every 4-6 hours for management of wheezing, chest tightness, shortness of breath.  You may return to the ED for new or concerning symptoms.

## 2021-05-24 ENCOUNTER — Other Ambulatory Visit (HOSPITAL_BASED_OUTPATIENT_CLINIC_OR_DEPARTMENT_OTHER): Payer: Self-pay

## 2021-05-24 DIAGNOSIS — G4710 Hypersomnia, unspecified: Secondary | ICD-10-CM

## 2021-05-24 DIAGNOSIS — R0681 Apnea, not elsewhere classified: Secondary | ICD-10-CM

## 2021-05-24 DIAGNOSIS — R0683 Snoring: Secondary | ICD-10-CM

## 2021-06-23 ENCOUNTER — Ambulatory Visit
Admission: EM | Admit: 2021-06-23 | Discharge: 2021-06-23 | Disposition: A | Discharge status: Home / Self Care | Payer: 59 | Attending: Family Medicine | Admitting: Family Medicine

## 2021-06-23 ENCOUNTER — Other Ambulatory Visit: Payer: Self-pay

## 2021-06-23 DIAGNOSIS — S61215A Laceration without foreign body of left ring finger without damage to nail, initial encounter: Secondary | ICD-10-CM | POA: Diagnosis not present

## 2021-06-23 MED ORDER — HIBICLENS 4 % EX LIQD: Freq: Every day | CUTANEOUS | 0 refills | Status: DC | PRN

## 2021-06-23 MED ORDER — DOXYCYCLINE HYCLATE 100 MG PO TABS: 100.0000 mg | ORAL_TABLET | Freq: Two times a day (BID) | ORAL | 0 refills | Status: DC

## 2021-06-23 MED ORDER — KETOROLAC TROMETHAMINE 30 MG/ML IJ SOLN
30.0000 mg | Freq: Once | INTRAMUSCULAR | Status: AC
  Administered 2021-06-23: 30 mg via INTRAMUSCULAR

## 2021-06-23 NOTE — ED Triage Notes (Signed)
Pt has a laceration on his left hand/index finger. Pt states cut his index finger with a razor knife while at work.

## 2021-06-24 NOTE — ED Provider Notes (Addendum)
[facility]    CSN: [id number] Arrival date & time: 06/23/21  1816      History   Chief Complaint Chief Complaint  Patient presents with   Laceration    HPI Cesar Boyle is a 30 y.o. male.   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  He states his box cutter his wife ended up cutting him.  He did not have any first-aid equipment on hand so he put paper towels around the laceration and electrical tape and finished the job that he was working on prior to coming to the urgent care this evening.  He states eventually the bleeding slowed with pressure, pain is significant no numbness, tingling and able to move the finger entirely.  Rinsed the area with a garden hose but has not been able to clean it with anything thoroughly yet.  Last tetanus shot was 2021.   Past Medical History:  Diagnosis Date   ADD (attention deficit disorder)    Allergy    Asthma    Depression    Migraine headache    Substance abuse [facility])     Patient Active Problem List   Diagnosis Date Noted   Asthma 10/05/2011   ADD 10/07/2010   DEPRESSION 09/16/2010   MIGRAINE HEADACHE 09/16/2010    Past Surgical History:  Procedure Laterality Date   HERNIA REPAIR  age [age]   double   TONSILLECTOMY AND ADENOIDECTOMY         Home Medications    Prior to Admission medications   Medication Sig Start Date End Date Taking? Authorizing Provider  chlorhexidine (HIBICLENS) 4 % external liquid Apply topically daily as needed. 06/23/21  Yes Particia Nearing, PA-C  doxycycline (VIBRA-TABS) 100 MG tablet Take 1 tablet (100 mg total) by mouth 2 (two) times daily. 06/23/21  Yes Particia Nearing, PA-C  albuterol (PROVENTIL HFA;VENTOLIN HFA) 108 (90 BASE) MCG/ACT inhaler Inhale 2 puffs into the lungs every 4 (four) hours as needed for wheezing or shortness of breath. 05/16/14   Earley Favor, NP  albuterol  (PROVENTIL) (2.5 MG/3ML) 0.083% nebulizer solution Take 3 mLs (2.5 mg total) by nebulization every 6 (six) hours as needed for wheezing or shortness of breath. 05/16/14   Earley Favor, NP  montelukast (SINGULAIR) 10 MG tablet Take 1 tablet (10 mg total) by mouth at bedtime. 03/10/21   Antony Madura, PA-C  predniSONE (DELTASONE) 20 MG tablet Take 2 tablets (40 mg total) by mouth daily. 03/10/21   Antony Madura, PA-C    Family History Family History  Problem Relation Age of Onset   Diabetes Other        1st degree relative   Hyperlipidemia Other    Cancer Other        lung, prostate   Stroke Other    Heart disease Other    Cancer Father     Social History Social History   Tobacco Use   Smoking status: Some Days   Smokeless tobacco: Former   Tobacco comments:    quit 2011  Substance Use Topics   Alcohol use: Yes    Comment: occassionally   Drug use: No    Comment: former     Allergies   Ceclor [cefaclor]   Review of Systems Review of Systems Per HPI  Physical Exam Triage Vital Signs ED Triage Vitals  Enc Vitals Group     BP 06/23/21  1941 115/77     Pulse Rate 06/23/21 1941 (!) 119     Resp 06/23/21 1941 18     Temp 06/23/21 1941 97.8 F (36.6 C)     Temp Source 06/23/21 1941 Oral     SpO2 06/23/21 1941 95 %     Weight --      Height --      Head Circumference --      Peak Flow --      Pain Score 06/23/21 1940 5     Pain Loc --      Pain Edu? --      Excl. in GC? --    No data found.  Updated Vital Signs BP 115/77 (BP Location: Left Arm)   Pulse (!) 119   Temp 97.8 F (36.6 C) (Oral)   Resp 18   SpO2 95%   Visual Acuity Right Eye Distance:   Left Eye Distance:   Bilateral Distance:    Right Eye Near:   Left Eye Near:    Bilateral Near:     Physical Exam Vitals and nursing note reviewed.  Constitutional:      Appearance: Normal appearance.  HENT:     Head: Atraumatic.  Eyes:     Extraocular Movements: Extraocular movements intact.      Conjunctiva/sclera: Conjunctivae normal.  Cardiovascular:     Rate and Rhythm: Normal rate and regular rhythm.  Pulmonary:     Effort: Pulmonary effort is normal.     Breath sounds: Normal breath sounds.  Musculoskeletal:        General: Normal range of motion.     Cervical back: Normal range of motion and neck supple.     Comments: Mild edema surrounding laceration to left index finger but ROM intact  Skin:    General: Skin is warm and dry.     Comments: 2 - 2.5 cm well approximated laceration to PIP region of left index finger, bleeding well controlled  Neurological:     General: No focal deficit present.     Mental Status: He is oriented to person, place, and time.  Psychiatric:        Mood and Affect: Mood normal.        Thought Content: Thought content normal.        Judgment: Judgment normal.   UC Treatments / Results  Labs (all labs ordered are listed, but only abnormal results are displayed) Labs Reviewed - No data to display  EKG   Radiology No results found.  Procedures Laceration Repair  Date/Time: 06/23/2021 9:02 PM Performed by: Particia Nearing, PA-C Authorized by: Particia Nearing, PA-C   Consent:    Consent obtained:  Verbal   Consent given by:  Patient   Risks, benefits, and alternatives were discussed: yes     Risks discussed:  Infection, pain, poor cosmetic result and nerve damage   Alternatives discussed: steri strips. Universal protocol:    Procedure explained and questions answered to patient or proxy's satisfaction: yes     Relevant documents present and verified: yes     Patient identity confirmed:  Verbally with patient Anesthesia:    Anesthesia method:  Local infiltration   Local anesthetic:  Lidocaine 1% w/o epi Laceration details:    Location:  Finger   Finger location:  L index finger   Length (cm):  2.5   Depth (mm):  3 Pre-procedure details:    Preparation:  Patient was prepped and draped in usual sterile  fashion Exploration:    Limited defect created (wound extended): no     Hemostasis achieved with:  Direct pressure   Wound exploration: wound explored through full range of motion     Contaminated: no   Treatment:    Area cleansed with:  Chlorhexidine and saline   Amount of cleaning:  Standard   Irrigation solution:  Sterile saline   Irrigation method:  Pressure wash   Visualized foreign bodies/material removed: no     Debridement:  None   Undermining:  None   Scar revision: no     Layers repaired: dermis. Skin repair:    Repair method:  Sutures   Suture size:  5-0   Suture material:  Prolene   Number of sutures:  6 Approximation:    Approximation:  Close Repair type:    Repair type:  Simple Post-procedure details:    Dressing:  Splint for protection and non-adherent dressing   Procedure completion:  Tolerated well, no immediate complications (including critical care time)  Medications Ordered in UC Medications  ketorolac (TORADOL) 30 MG/ML injection 30 mg (30 mg Intramuscular Given 06/23/21 2004)    Initial Impression / Assessment and Plan / UC Course  I have reviewed the triage vital signs and the nursing notes.  Pertinent labs & imaging results that were available during my care of the patient were reviewed by me and considered in my medical decision making (see chart for details).     Wound closed with sutures without complication, home wound care reviewed at length and hibiclens and doxycycline sent to protect from infection. Tdap UTD, 2021. Strict return precautions given for signs of infection or other worsening sxs. Dressing and splint placed today for protection.   Final Clinical Impressions(s) / UC Diagnoses   Final diagnoses:  Laceration of left ring finger without foreign body without damage to nail, initial encounter   Discharge Instructions   None    ED Prescriptions     Medication Sig Dispense Auth. Provider   doxycycline (VIBRA-TABS) 100 MG tablet  Take 1 tablet (100 mg total) by mouth 2 (two) times daily. 14 tablet Particia Nearing, [place]   chlorhexidine (HIBICLENS) 4 % external liquid Apply topically daily as needed. 120 mL Particia Nearing, [place]      PDMP not reviewed this encounter.   Particia Nearing, [place] 06/24/21 1607    Roosvelt Maser [place], [place] 06/24/21 [phone]

## 2021-07-23 ENCOUNTER — Ambulatory Visit (HOSPITAL_BASED_OUTPATIENT_CLINIC_OR_DEPARTMENT_OTHER): Payer: 59 | Attending: Internal Medicine | Admitting: Internal Medicine

## 2021-07-23 ENCOUNTER — Other Ambulatory Visit: Payer: Self-pay

## 2021-07-23 DIAGNOSIS — R0683 Snoring: Secondary | ICD-10-CM | POA: Diagnosis not present

## 2021-07-23 DIAGNOSIS — G4710 Hypersomnia, unspecified: Secondary | ICD-10-CM | POA: Diagnosis not present

## 2021-07-23 DIAGNOSIS — R0681 Apnea, not elsewhere classified: Secondary | ICD-10-CM

## 2021-07-23 DIAGNOSIS — G4730 Sleep apnea, unspecified: Secondary | ICD-10-CM | POA: Insufficient documentation

## 2021-08-11 NOTE — Procedures (Signed)
   NAME: Cesar Boyle DATE OF BIRTH:  27-Mar-1991 MEDICAL RECORD NUMBER [id number]  LOCATION: [facility]  PHYSICIAN: Deretha Emory  DATE OF STUDY: 07/23/2021  SLEEP STUDY TYPE: Nocturnal Polysomnogram               REFERRING PHYSICIAN: Deretha Emory, MD  EPWORTH SLEEPINESS SCORE:  5 HEIGHT: 6' (182.9 cm)  WEIGHT: 245 lb (111.1 kg)    Body mass index is 33.23 kg/m.  NECK SIZE: 17 in.  CLINICAL INFORMATION The patient was referred to the sleep center for evaluation Snoring, Witnesses Apnea / Gasping During Sleep, Excessive daytime sleepiness, and leg movements in sleep. HSAT were unsuccessful.   MEDICATIONS No sleep medicine administered.Marland Kitchen  SLEEP STUDY TECHNIQUE A multi-channel overnight Polysomnography study was performed. The channels recorded and monitored were central and occipital EEG, electrooculogram (EOG), submentalis EMG (chin), nasal and oral airflow, thoracic and abdominal wall motion, anterior tibialis EMG, snore microphone, electrocardiogram, and a pulse oximetry.  TECHNICAL COMMENTS Comments added by Technician: Patient talked in his/her sleep. Comments added by Scorer: N/A  SLEEP ARCHITECTURE The study was initiated at 10:51:27 PM and terminated at 4:57:08 AM. The total recorded time was 365.7 minutes. EEG confirmed total sleep time was 308.5 minutes yielding a sleep efficiency of 84.4%%. Sleep onset after lights out was 21.4 minutes with a REM latency of 83.0 minutes. The patient spent 1.0%% of the night in stage N1 sleep, 67.6%% in stage N2 sleep, 18.8%% in stage N3 and 12.6% in REM. Wake after sleep onset (WASO) was 35.8 minutes. The Arousal Index was 6.2/hour.  RESPIRATORY PARAMETERS There were a total of 12 respiratory disturbances out of which 2 were apneas ( 0 obstructive, 0 mixed, 2 central) and 10 hypopneas. The apnea/hypopnea index (AHI) was 2.3 events/hour. The central sleep apnea index was 0.4 events/hour. The REM AHI was 13.8  events/hour and NREM AHI was 0.7 events/hour. The supine AHI was 1.8 events/hour and the non supine AHI was 2.7/hour. He slept supine during 42.45% of sleep. Respiratory disturbance index wasd 4.1 events/hour overall and 21 events/hour in REM sleep. Respiratory disturbances were associated with oxygen desaturation down to a nadir of 88.0% during sleep. The mean oxygen saturation during the study was 92.8%.  LEG MOVEMENT DATA The total leg movements were 0 with a resulting leg movement index of 0.0/hr . Associated arousal with leg movement index was 0.0/hr.  CARDIAC DATA The underlying cardiac rhythm was most consistent with sinus rhythm. Mean heart rate during sleep was 49.7 bpm. Additional rhythm abnormalities include None.  IMPRESSIONS - No Significant Obstructive Sleep Apnea (OSA) - No significant periodic leg movements(PLMs) during sleep.   DIAGNOSIS - Excessive daytime sleepiness  RECOMMENDATIONS - There does not appear to be any indication for treatment of sleep disordered breathing based on this study.   Deretha Emory Sleep specialist, American Board of Internal Medicine  ELECTRONICALLY SIGNED ON:  08/11/2021, 8:04 PM [facility] PH: [phone]   FX: [phone] ACCREDITED BY THE AMERICAN ACADEMY OF SLEEP MEDICINE

## 2021-10-12 ENCOUNTER — Other Ambulatory Visit: Payer: Self-pay | Admitting: Physician Assistant

## 2021-10-12 ENCOUNTER — Other Ambulatory Visit: Payer: Self-pay

## 2021-10-12 ENCOUNTER — Ambulatory Visit
Admission: RE | Admit: 2021-10-12 | Discharge: 2021-10-12 | Disposition: A | Discharge status: Home / Self Care | Payer: 59 | Source: Ambulatory Visit | Attending: Physician Assistant | Admitting: Physician Assistant

## 2021-10-12 DIAGNOSIS — R10814 Left lower quadrant abdominal tenderness: Secondary | ICD-10-CM

## 2021-10-12 DIAGNOSIS — K4090 Unilateral inguinal hernia, without obstruction or gangrene, not specified as recurrent: Secondary | ICD-10-CM

## 2021-10-12 MED ORDER — IOPAMIDOL (ISOVUE-300) INJECTION 61%
100.0000 mL | Freq: Once | INTRAVENOUS | Status: AC | PRN
  Administered 2021-10-12: 100 mL via INTRAVENOUS

## 2022-03-29 ENCOUNTER — Other Ambulatory Visit: Payer: Self-pay

## 2022-03-29 ENCOUNTER — Encounter (HOSPITAL_BASED_OUTPATIENT_CLINIC_OR_DEPARTMENT_OTHER): Payer: Self-pay

## 2022-03-29 ENCOUNTER — Emergency Department (HOSPITAL_BASED_OUTPATIENT_CLINIC_OR_DEPARTMENT_OTHER): Payer: 59 | Admitting: Radiology

## 2022-03-29 ENCOUNTER — Emergency Department (HOSPITAL_BASED_OUTPATIENT_CLINIC_OR_DEPARTMENT_OTHER)
Admission: EM | Admit: 2022-03-29 | Discharge: 2022-03-30 | Disposition: A | Discharge status: Home / Self Care | Payer: 59 | Attending: Emergency Medicine | Admitting: Emergency Medicine

## 2022-03-29 DIAGNOSIS — J45909 Unspecified asthma, uncomplicated: Secondary | ICD-10-CM | POA: Diagnosis not present

## 2022-03-29 DIAGNOSIS — W228XXA Striking against or struck by other objects, initial encounter: Secondary | ICD-10-CM | POA: Diagnosis not present

## 2022-03-29 DIAGNOSIS — Z7951 Long term (current) use of inhaled steroids: Secondary | ICD-10-CM | POA: Insufficient documentation

## 2022-03-29 DIAGNOSIS — S6982XA Other specified injuries of left wrist, hand and finger(s), initial encounter: Secondary | ICD-10-CM

## 2022-03-29 DIAGNOSIS — Z87891 Personal history of nicotine dependence: Secondary | ICD-10-CM | POA: Insufficient documentation

## 2022-03-29 DIAGNOSIS — S6992XA Unspecified injury of left wrist, hand and finger(s), initial encounter: Secondary | ICD-10-CM | POA: Insufficient documentation

## 2022-03-29 NOTE — ED Provider Notes (Signed)
? ?[facility] ?Provider Note: Georgena Spurling, MD, FACEP ? ?CSN: [id number] ?MRN: [id number] ?ARRIVAL: 03/29/22 at 2035 ?ROOM: [id number]/[id number] ? ? ?CHIEF COMPLAINT  ?Hand Injury ? ? ?HISTORY OF PRESENT ILLNESS  ?03/29/22 11:46 PM ?Cesar Boyle is a 31 y.o. male who is left hand was struck within the close range by an industrial grade paint sprayer at approximately 1500 PSI.  This occurred 2 hours prior to arrival.  He is having pain in the first 3 fingers of the left hand which he rates as a 4 out of 10.  It is worse with movement or palpation but he has full range of motion and normal sensation.  He describes it as feeling like his hand was blasted with sand. ? ? ?Past Medical History:  ?Diagnosis Date  ? ADD (attention deficit disorder)   ? Allergy   ? Asthma   ? Depression   ? Migraine headache   ? Substance abuse ([place])   ? ? ?Past Surgical History:  ?Procedure Laterality Date  ? HERNIA REPAIR  age 44 1994  ? double  ? TONSILLECTOMY AND ADENOIDECTOMY    ? ? ?Family History  ?Problem Relation Age of Onset  ? Diabetes Other   ?     1st degree relative  ? Hyperlipidemia Other   ? Cancer Other   ?     lung, prostate  ? Stroke Other   ? Heart disease Other   ? Cancer Father   ? ? ?Social History  ? ?Tobacco Use  ? Smoking status: Former  ?  Types: Cigarettes  ? Smokeless tobacco: Former  ? Tobacco comments:  ?  quit 2011  ?Substance Use Topics  ? Alcohol use: Yes  ?  Comment: occassionally  ? Drug use: Yes  ?  Frequency: 7.0 times per week  ?  Types: Marijuana  ?  Comment: former  ? ? ?Prior to Admission medications   ?Medication Sig Start Date End Date Taking? Authorizing Provider  ?albuterol (PROVENTIL HFA;VENTOLIN HFA) 108 (90 BASE) MCG/ACT inhaler Inhale 2 puffs into the lungs every 4 (four) hours as needed for wheezing or shortness of breath. 05/16/14   Junius Creamer, NP  ?albuterol (PROVENTIL) (2.5 MG/3ML) 0.083% nebulizer solution Take 3 mLs (2.5 mg total) by nebulization every 6 (six) hours as needed for  wheezing or shortness of breath. 05/16/14   Junius Creamer, NP  ?chlorhexidine (HIBICLENS) 4 % external liquid Apply topically daily as needed. 06/23/21   Volney American, PA-C  ?doxycycline (VIBRA-TABS) 100 MG tablet Take 1 tablet (100 mg total) by mouth 2 (two) times daily. 06/23/21   Volney American, PA-C  ?montelukast (SINGULAIR) 10 MG tablet Take 1 tablet (10 mg total) by mouth at bedtime. 03/10/21   Antonietta Breach, PA-C  ?predniSONE (DELTASONE) 20 MG tablet Take 2 tablets (40 mg total) by mouth daily. 03/10/21   Antonietta Breach, PA-C  ? ? ?Allergies ?Ceclor [cefaclor] ? ? ?REVIEW OF SYSTEMS  ?Negative except as noted here or in the History of Present Illness. ? ? ?PHYSICAL EXAMINATION  ?Initial Vital Signs ?Blood pressure 113/85, pulse 69, temperature 98.3 ?F (36.8 ?C), temperature source Oral, resp. rate 17, height 6' (1.829 m), weight 111.1 kg, SpO2 100 %. ? ?Examination ?General: Well-developed, well-nourished male in no acute distress; appearance consistent with age of record ?HENT: normocephalic; atraumatic ?Eyes: Normal appearance ?Neck: supple ?Heart: regular rate and rhythm ?Lungs: clear to auscultation bilaterally ?Abdomen: soft; nondistended; nontender; bowel sounds present ?Extremities: No deformity; full  range of motion; no puncture wounds, bullae or fluctuant areas to suggest pain penetration of skin of left hand ?Neurologic: Awake, alert and oriented; motor function intact in all extremities and symmetric; no facial droop ?Skin: Warm and dry ?Psychiatric: Normal mood and affect ? ? ?RESULTS  ?Summary of this visit's results, reviewed and interpreted by myself: ? ? EKG Interpretation ? ?Date/Time:    ?Ventricular Rate:    ?PR Interval:    ?QRS Duration:   ?QT Interval:    ?QTC Calculation:   ?R Axis:     ?Text Interpretation:   ?  ? ?  ? ?Laboratory Studies: ?No results found for this or any previous visit (from the past 24 hour(s)). ?Imaging Studies: ?DG Hand Complete Left ? ?Result Date:  03/29/2022 ?CLINICAL DATA:  Left hand injury EXAM: LEFT HAND - COMPLETE 3+ VIEW COMPARISON:  None. FINDINGS: No fracture or dislocation is seen. The joint spaces are preserved. Visualized soft tissues are within normal limits. IMPRESSION: Negative. Electronically Signed   By: Julian Hy M.D.   On: 03/29/2022 21:53   ? ?ED COURSE and MDM  ?Nursing notes, initial and subsequent vitals signs, including pulse oximetry, reviewed and interpreted by myself. ? ?Vitals:  ? 03/29/22 2118 03/29/22 2119 03/29/22 2122 03/29/22 2333  ?BP: 114/86   113/85  ?Pulse: 73   69  ?Resp: 16   17  ?Temp:   98.3 ?F (36.8 ?C)   ?TempSrc:   Oral   ?SpO2: 99%   100%  ?Weight:  111.1 kg    ?Height:  6' (1.829 m)    ? ?Medications - No data to display ? ?No evidence of significant injury on radiograph or examination.  No evidence of paint penetration of skin. ? ?PROCEDURES  ?Procedures ? ? ?ED DIAGNOSES  ?No diagnosis found. ? ? ?  ?Shanon Rosser, MD ?03/29/22 2358 ? ?

## 2022-03-29 NOTE — ED Triage Notes (Signed)
Patient here POV from Home with hand Injury. ? ?Endorses being sprayed by [employer] Grade Pain Sprayer (Water Based Enamel) to Left Hand within Close Range at approximately 1500 PSI approximately 2 hours PTA. ? ?NAD Noted during Triage. A&Ox4. GCS 15. Ambulatory. ?

## 2022-04-02 IMAGING — DX DG KNEE AP/LAT W/ SUNRISE*L*
3 series · 3 of 3 positions shown · non-contrast
Comparison: None.

CLINICAL DATA: Left knee pain

EXAM:
LEFT KNEE 3 VIEWS

[knee ap]
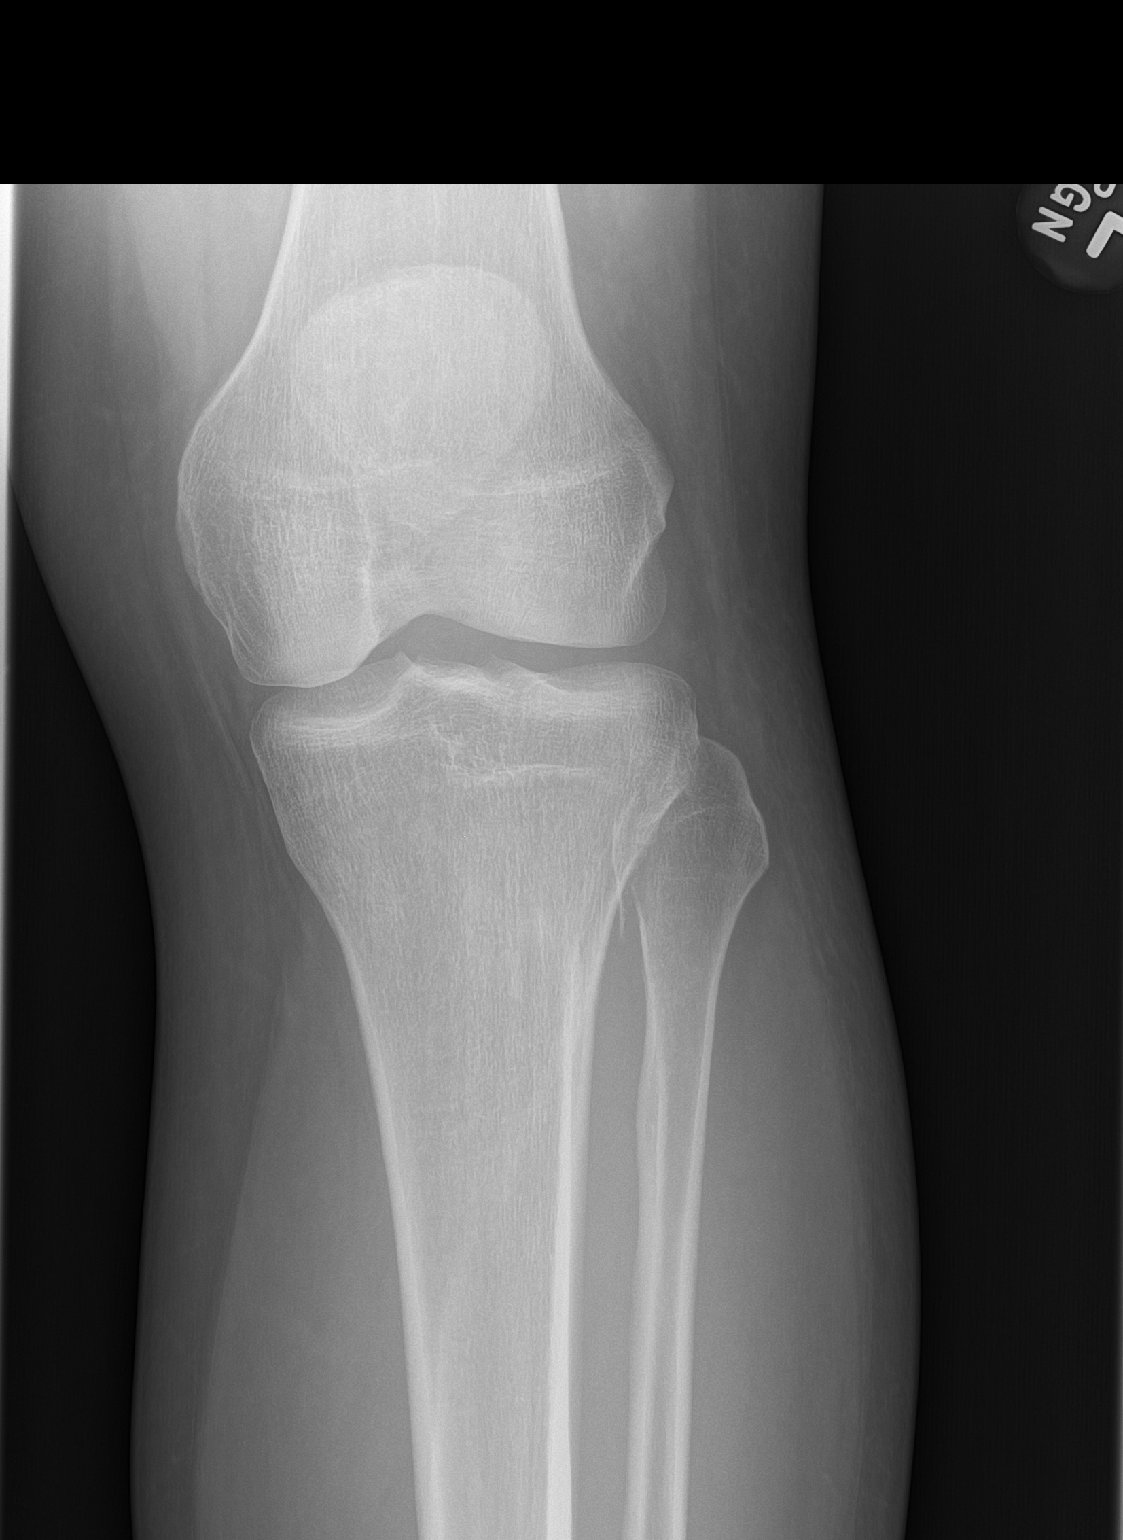

[knee lat]
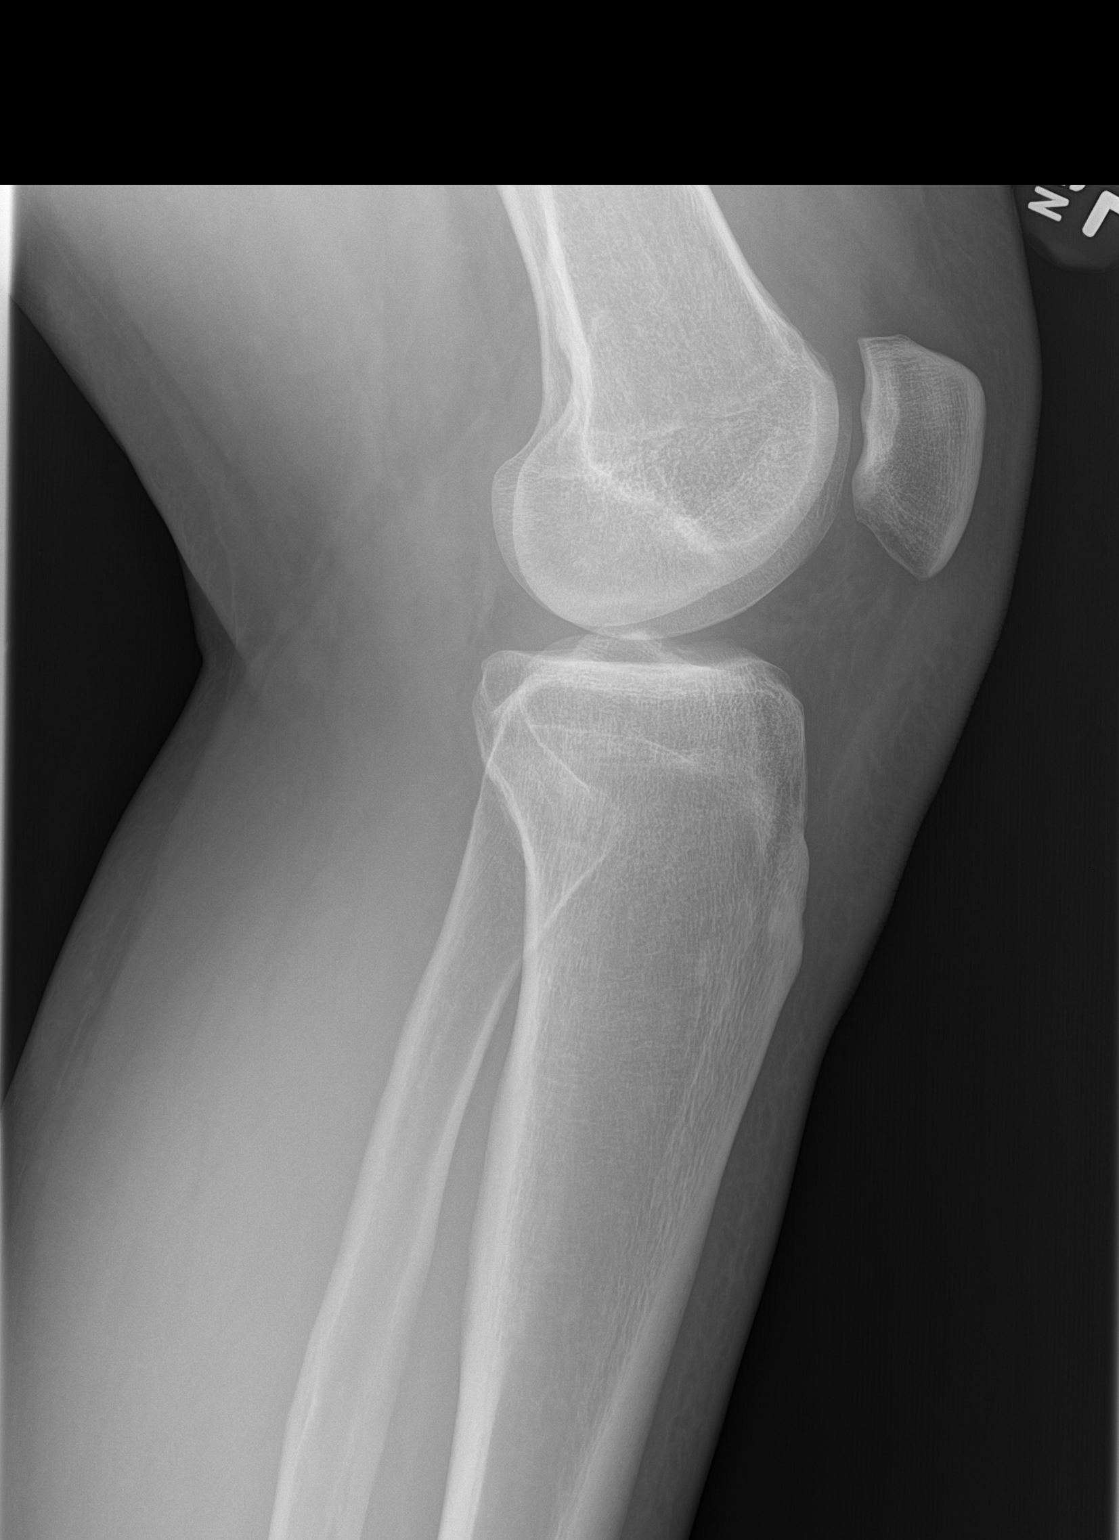

[patella]
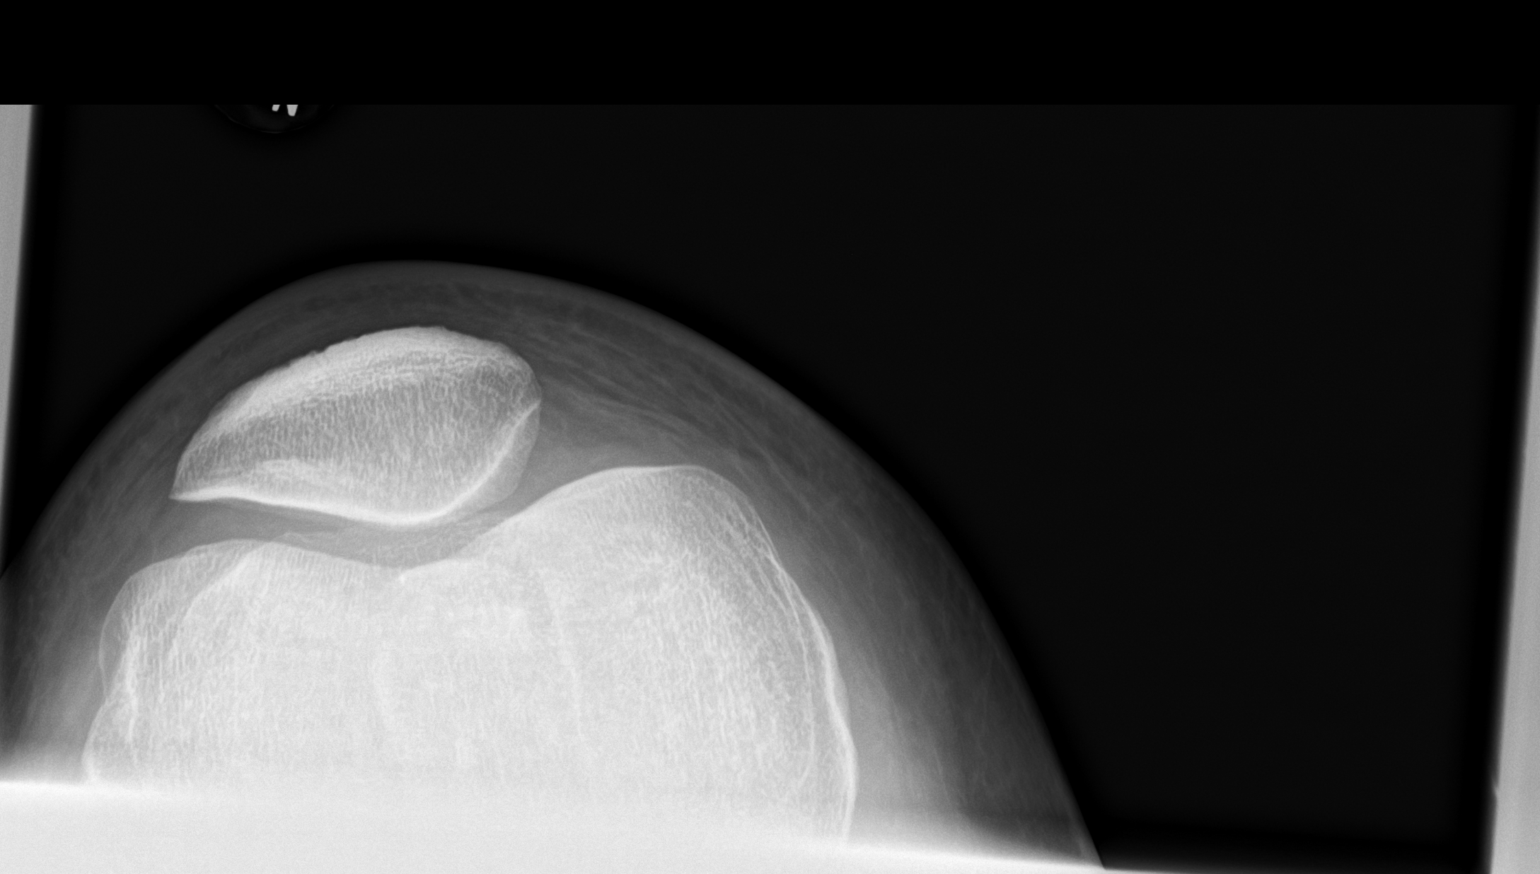

[3 of 3 positions shown; findings below may reference images not displayed]

FINDINGS: No evidence of fracture, dislocation, or joint effusion. No evidence
of arthropathy or other focal bone abnormality. Soft tissues are
unremarkable.
IMPRESSION: Negative.

## 2022-12-20 ENCOUNTER — Other Ambulatory Visit (HOSPITAL_COMMUNITY): Payer: Self-pay | Admitting: Internal Medicine

## 2022-12-20 DIAGNOSIS — K5792 Diverticulitis of intestine, part unspecified, without perforation or abscess without bleeding: Secondary | ICD-10-CM

## 2022-12-21 ENCOUNTER — Encounter (HOSPITAL_COMMUNITY): Payer: Self-pay

## 2022-12-21 ENCOUNTER — Ambulatory Visit (HOSPITAL_COMMUNITY)
Admission: RE | Admit: 2022-12-21 | Discharge: 2022-12-21 | Disposition: A | Discharge status: Home / Self Care | Payer: 59 | Source: Ambulatory Visit | Attending: Internal Medicine | Admitting: Internal Medicine

## 2022-12-21 DIAGNOSIS — K5792 Diverticulitis of intestine, part unspecified, without perforation or abscess without bleeding: Secondary | ICD-10-CM

## 2022-12-21 MED ORDER — IOHEXOL 300 MG/ML  SOLN
100.0000 mL | Freq: Once | INTRAMUSCULAR | Status: AC | PRN
  Administered 2022-12-21: 100 mL via INTRAVENOUS

## 2022-12-21 MED ORDER — IOHEXOL 9 MG/ML PO SOLN
500.0000 mL | ORAL | Status: AC
  Administered 2022-12-21 (×2): 500 mL via ORAL

## 2022-12-21 MED ORDER — IOHEXOL 9 MG/ML PO SOLN
ORAL | Status: AC
  Filled 2022-12-21: qty 1000

## 2024-04-19 ENCOUNTER — Emergency Department

## 2024-04-19 ENCOUNTER — Emergency Department
Admission: EM | Admit: 2024-04-19 | Discharge: 2024-04-19 | Disposition: A | Discharge status: Home / Self Care | Attending: Emergency Medicine | Admitting: Emergency Medicine

## 2024-04-19 ENCOUNTER — Other Ambulatory Visit: Payer: Self-pay

## 2024-04-19 DIAGNOSIS — J4540 Moderate persistent asthma, uncomplicated: Secondary | ICD-10-CM | POA: Diagnosis not present

## 2024-04-19 DIAGNOSIS — R079 Chest pain, unspecified: Secondary | ICD-10-CM | POA: Diagnosis present

## 2024-04-19 DIAGNOSIS — J45909 Unspecified asthma, uncomplicated: Secondary | ICD-10-CM

## 2024-04-19 LAB — CBC
HCT: 42 % (ref 39.0–52.0)
Hemoglobin: 14.2 g/dL (ref 13.0–17.0)
MCH: 30.7 pg (ref 26.0–34.0)
MCHC: 33.8 g/dL (ref 30.0–36.0)
MCV: 90.9 fL (ref 80.0–100.0)
Platelets: 217 10*3/uL (ref 150–400)
RBC: 4.62 MIL/uL (ref 4.22–5.81)
RDW: 11.9 % (ref 11.5–15.5)
WBC: 9 10*3/uL (ref 4.0–10.5)
nRBC: 0 % (ref 0.0–0.2)

## 2024-04-19 LAB — BASIC METABOLIC PANEL WITH GFR
Anion gap: 13 (ref 5–15)
BUN: 12 mg/dL (ref 6–20)
CO2: 19 mmol/L — ABNORMAL LOW (ref 22–32)
Calcium: 9.2 mg/dL (ref 8.9–10.3)
Chloride: 109 mmol/L (ref 98–111)
Creatinine, Ser: 0.76 mg/dL (ref 0.61–1.24)
GFR, Estimated: >60 mL/min (ref >60)
Glucose, Bld: 102 mg/dL — ABNORMAL HIGH (ref 70–99)
Potassium: 4 mmol/L (ref 3.5–5.1)
Sodium: 141 mmol/L (ref 135–145)

## 2024-04-19 LAB — TROPONIN I (HIGH SENSITIVITY): Troponin I (High Sensitivity): 3 ng/L (ref <18)

## 2024-04-19 MED ORDER — ALBUTEROL SULFATE HFA 108 (90 BASE) MCG/ACT IN AERS: 2.0000 | INHALATION_SPRAY | Freq: Four times a day (QID) | RESPIRATORY_TRACT | 2 refills | Status: DC | PRN

## 2024-04-19 MED ORDER — PREDNISONE 10 MG PO TABS: 10.0000 mg | ORAL_TABLET | Freq: Every day | ORAL | 0 refills | Status: DC

## 2024-04-19 MED ORDER — PREDNISONE 20 MG PO TABS
60.0000 mg | ORAL_TABLET | Freq: Once | ORAL | Status: AC
  Administered 2024-04-19: 60 mg via ORAL
  Filled 2024-04-19: qty 3

## 2024-04-19 NOTE — ED Triage Notes (Signed)
 Pt reports chest pain/tightness that began earlier today, pt reports left arm pain also. Pt has hx asthma and states over the past 3 days he feels like he has had some flare ups. Pt denies known cardiac hx.

## 2024-04-19 NOTE — ED Provider Notes (Signed)
 [name]    Event Date/Time   First MD Initiated Contact with Patient 04/19/24 [phone]     (approximate)  History   Chief Complaint: Chest Pain  HPI  Cesar Boyle is a 33 y.o. male with a past medical history of asthma, presents to the emergency department for chest discomfort.  According to the patient was last month or so he has been experiencing frequent asthma exacerbation/flares.  Patient states he ran out of his albuterol  inhaler around this time as well.  Try to get a refill but he had not seen his doctor recently so they refused the refill.  He has an appointment next week to see his doctor.  Patient states today around 1030 this morning he experienced some discomfort in his chest.  Patient was concerned given a family history of cardiac disease on his mother side so they came to the emergency department this evening for evaluation.  Overall patient appears well, no distress.  Physical Exam   Triage Vital Signs: ED Triage Vitals  Encounter Vitals Group     BP 04/19/24 0126 129/88     Systolic BP Percentile --      Diastolic BP Percentile --      Pulse Rate 04/19/24 0126 88     Resp 04/19/24 0126 18     Temp 04/19/24 0126 97.8 F (36.6 C)     Temp Source 04/19/24 0126 Oral     SpO2 04/19/24 0126 96 %     Weight 04/19/24 0125 250 lb (113.4 kg)     Height 04/19/24 0125 6' (1.829 m)     Head Circumference --      Peak Flow --      Pain Score 04/19/24 0125 3     Pain Loc --      Pain Education --      Exclude from Growth Chart --     Most recent vital signs: Vitals:   04/19/24 0126  BP: 129/88  Pulse: 88  Resp: 18  Temp: 97.8 F (36.6 C)  SpO2: 96%    General: Awake, no distress.  CV:  Good peripheral perfusion.  Regular rate and rhythm  Resp:  Normal effort.  Equal breath sounds bilaterally.  Very slight end expiratory wheeze. Abd:  No distention.  Soft, nontender.  No rebound or guarding.  ED Results / Procedures /  Treatments   EKG  EKG viewed and interpreted by myself shows normal sinus rhythm 84 bpm with a narrow QRS, normal axis, normal intervals, no concerning ST changes.  RADIOLOGY  I have reviewed and interpreted chest x-ray images.  No consolidation on my evaluation. Radiology has read the x-ray as negative.   MEDICATIONS ORDERED IN ED: Medications - No data to display   IMPRESSION / MDM / ASSESSMENT AND PLAN / ED COURSE  I reviewed the triage vital signs and the nursing notes.  Patient's presentation is most consistent with acute presentation with potential threat to life or bodily function.  Patient presents to the emergency department for chest discomfort earlier today which has since resolved.  Patient also states frequent asthma exacerbations over the past 1 month and somewhat worse over the last 3 days.  No longer has albuterol  inhaler but has been using his nebulizer at times at home.  Overall the patient appears well, reassuring physical exam.  Very slight end expiratory wheeze.  Patient's workup shows a reassuring CBC chemistry and a negative troponin.  Chest x-ray  is clear.  Given the patient's reassuring labs including a negative troponin and reassuring physical exam and vitals believe the patient is safe for discharge home with outpatient follow-up.  Patient states they have been trying to get in with a pulmonologist we will place a referral for the patient.  Given the patient's worsening asthma exacerbations over the past 1 month we will place the patient on a 12-day prednisone  taper and I will provide refills of his albuterol  inhaler.  Patient agreeable to plan of care.  FINAL CLINICAL IMPRESSION(S) / ED DIAGNOSES   Asthma exacerbation Chest pain    Note:  This document was prepared using Dragon voice recognition software and may include unintentional dictation errors.   Ruth Cove, MD 04/19/24 [phone]

## 2024-04-19 NOTE — Discharge Instructions (Addendum)
 Please begin taking prednisone  tomorrow 04/20/2024.  Please use your albuterol  as needed, as written.  Please call the number provided for pulmonology to arrange a follow-up appointment.  Return to the emergency department for any worsening chest pain trouble breathing or any other symptom personally concerning to yourself.

## 2024-05-23 NOTE — Progress Notes (Signed)
 New Patient Note  RE: Cesar Boyle MRN: [id number] DOB: [birth year] Date of Office Visit: 05/24/2024  Consult requested by: Windell Hasty, DO Primary care provider: Windell Hasty, DO  Chief Complaint: No chief complaint on file.  History of Present Illness: I had the pleasure of seeing Mohan Erven for initial evaluation at the [facility] on 05/24/2024. He is a 33 y.o. male, who is referred here by Windell Hasty, DO for the evaluation of ***.  Discussed the use of AI scribe software for clinical note transcription with the patient, who gave verbal consent to proceed.  History of Present Illness             ***  Assessment and Plan: Celso is a 33 y.o. male with: ***  Assessment and Plan               No follow-ups on file.  No orders of the defined types were placed in this encounter.  Lab Orders  No laboratory test(s) ordered today    Other allergy screening: Asthma: {Blank single:19197::yes,no} Rhino conjunctivitis: {Blank single:19197::yes,no} Food allergy: {Blank single:19197::yes,no} Medication allergy: {Blank single:19197::yes,no} Hymenoptera allergy: {Blank single:19197::yes,no} Urticaria: {Blank single:19197::yes,no} Eczema:{Blank single:19197::yes,no} History of recurrent infections suggestive of immunodeficency: {Blank single:19197::yes,no}  Diagnostics: Spirometry:  Tracings reviewed. His effort: {Blank single:19197::Good reproducible efforts.,It was hard to get consistent efforts and there is a question as to whether this reflects a maximal maneuver.,Poor effort, data can not be interpreted.} FVC: ***L FEV1: ***L, ***% predicted FEV1/FVC ratio: ***% Interpretation: {Blank single:19197::Spirometry consistent with mild obstructive disease,Spirometry consistent with moderate obstructive disease,Spirometry consistent with severe obstructive disease,Spirometry consistent with  possible restrictive disease,Spirometry consistent with mixed obstructive and restrictive disease,Spirometry uninterpretable due to technique,Spirometry consistent with normal pattern,No overt abnormalities noted given today's efforts}.  Please see scanned spirometry results for details.  Skin Testing: {Blank single:19197::Select foods,Environmental allergy panel,Environmental allergy panel and select foods,Food allergy panel,None,Deferred due to recent antihistamines use}. *** Results discussed with patient/family.   Past Medical History: Patient Active Problem List   Diagnosis Date Noted  . Asthma 10/05/2011  . ADD 10/07/2010  . DEPRESSION 09/16/2010  . MIGRAINE HEADACHE 09/16/2010   Past Medical History:  Diagnosis Date  . ADD (attention deficit disorder)   . Allergy   . Asthma   . Depression   . Migraine headache   . Substance abuse [facility])    Past Surgical History: Past Surgical History:  Procedure Laterality Date  . HERNIA REPAIR  age 89 3   double  . TONSILLECTOMY AND ADENOIDECTOMY     Medication List:  Current Outpatient Medications  Medication Sig Dispense Refill  . albuterol  (PROVENTIL  HFA;VENTOLIN  HFA) 108 (90 BASE) MCG/ACT inhaler Inhale 2 puffs into the lungs every 4 (four) hours as needed for wheezing or shortness of breath. 1 Inhaler 0  . albuterol  (PROVENTIL ) (2.5 MG/3ML) 0.083% nebulizer solution Take 3 mLs (2.5 mg total) by nebulization every 6 (six) hours as needed for wheezing or shortness of breath. 75 mL 12  . albuterol  (VENTOLIN  HFA) 108 (90 Base) MCG/ACT inhaler Inhale 2 puffs into the lungs every 6 (six) hours as needed for wheezing or shortness of breath. 8 g 2  . predniSONE  (DELTASONE ) 10 MG tablet Take 1 tablet (10 mg total) by mouth daily. Day 1-3: take 4 tablets PO daily Day 4-6: take 3 tablets PO daily Day 7-9: take 2 tablets PO daily Day 10-12: take 1 tablet PO daily 30 tablet 0   No current facility-administered  medications  for this visit.   Allergies: Allergies  Allergen Reactions  . Ceclor [Cefaclor] Rash    Childhood allergy   Social History: Social History   Socioeconomic History  . Marital status: Married    Spouse name: Not on file  . Number of children: Not on file  . Years of education: Not on file  . Highest education level: Not on file  Occupational History  . Occupation: Consulting civil engineer  Tobacco Use  . Smoking status: Former    Types: Cigarettes  . Smokeless tobacco: Former  . Tobacco comments:    quit 2011  Substance and Sexual Activity  . Alcohol use: Yes    Comment: occassionally  . Drug use: Yes    Frequency: 7.0 times per week    Types: Marijuana    Comment: former  . Sexual activity: Not on file    Comment: Heroin  Other Topics Concern  . Not on file  Social History Narrative   Single   Regular exercise-yes   Some green   Social Drivers of Corporate investment banker Strain: Not on file  Food Insecurity: Not on file  Transportation Needs: Not on file  Physical Activity: Not on file  Stress: Not on file  Social Connections: Not on file   Lives in a ***. Smoking: *** Occupation: ***  Environmental HistorySurveyor, minerals in the house: Network engineer in the family room: {Blank single:19197::yes,no} Carpet in the bedroom: {Blank single:19197::yes,no} Heating: {Blank single:19197::electric,gas,heat pump} Cooling: {Blank single:19197::central,window,heat pump} Pet: {Blank single:19197::yes ***,no}  Family History: Family History  Problem Relation Age of Onset  . Diabetes Other        1st degree relative  . Hyperlipidemia Other   . Cancer Other        lung, prostate  . Stroke Other   . Heart disease Other   . Cancer Father    Problem                               Relation Asthma                                   *** Eczema                                *** Food allergy                           *** Allergic rhino conjunctivitis     ***  Review of Systems  Constitutional:  Negative for appetite change, chills, fever and unexpected weight change.  HENT:  Negative for congestion and rhinorrhea.   Eyes:  Negative for itching.  Respiratory:  Negative for cough, chest tightness, shortness of breath and wheezing.   Cardiovascular:  Negative for chest pain.  Gastrointestinal:  Negative for abdominal pain.  Genitourinary:  Negative for difficulty urinating.  Skin:  Negative for rash.  Neurological:  Negative for headaches.   Objective: There were no vitals taken for this visit. There is no height or weight on file to calculate BMI. Physical Exam Vitals and nursing note reviewed.  Constitutional:      Appearance: Normal appearance. He is well-developed.  HENT:     Head: Normocephalic and atraumatic.     Right  Ear: Tympanic membrane and external ear normal.     Left Ear: Tympanic membrane and external ear normal.     Nose: Nose normal.     Mouth/Throat:     Mouth: Mucous membranes are moist.     Pharynx: Oropharynx is clear.   Eyes:     Conjunctiva/sclera: Conjunctivae normal.    Cardiovascular:     Rate and Rhythm: Normal rate and regular rhythm.     Heart sounds: Normal heart sounds. No murmur heard.    No friction rub. No gallop.  Pulmonary:     Effort: Pulmonary effort is normal.     Breath sounds: Normal breath sounds. No wheezing, rhonchi or rales.   Musculoskeletal:     Cervical back: Neck supple.   Skin:    General: Skin is warm.     Findings: No rash.   Neurological:     Mental Status: He is alert and oriented to person, place, and time.   Psychiatric:        Behavior: Behavior normal.  The plan was reviewed with the patient/family, and all questions/concerned were addressed.  It was my pleasure to see Aizik today and participate in his care. Please feel free to contact me with any questions or concerns.  Sincerely,  Eudelia Hero, DO Allergy &  Immunology  [facility]  [facility] office: [phone] [facility] office: [phone]

## 2024-05-24 ENCOUNTER — Encounter: Payer: Self-pay | Admitting: Allergy

## 2024-05-24 ENCOUNTER — Other Ambulatory Visit: Payer: Self-pay

## 2024-05-24 ENCOUNTER — Ambulatory Visit: Payer: Self-pay | Admitting: Allergy

## 2024-05-24 VITALS — BP 120/88 | HR 70 | Temp 98.7°F | Resp 20 | Ht 71.65 in | Wt 254.8 lb

## 2024-05-24 DIAGNOSIS — J4541 Moderate persistent asthma with (acute) exacerbation: Secondary | ICD-10-CM | POA: Diagnosis not present

## 2024-05-24 DIAGNOSIS — K219 Gastro-esophageal reflux disease without esophagitis: Secondary | ICD-10-CM

## 2024-05-24 DIAGNOSIS — L509 Urticaria, unspecified: Secondary | ICD-10-CM | POA: Diagnosis not present

## 2024-05-24 DIAGNOSIS — J3089 Other allergic rhinitis: Secondary | ICD-10-CM | POA: Diagnosis not present

## 2024-05-24 MED ORDER — ALBUTEROL SULFATE (2.5 MG/3ML) 0.083% IN NEBU: 2.5000 mg | INHALATION_SOLUTION | RESPIRATORY_TRACT | 1 refills | Status: DC | PRN

## 2024-05-24 MED ORDER — OMEPRAZOLE 40 MG PO CPDR: 40.0000 mg | DELAYED_RELEASE_CAPSULE | Freq: Every day | ORAL | 1 refills | Status: DC

## 2024-05-24 MED ORDER — FLUTICASONE PROPIONATE 50 MCG/ACT NA SUSP: 1.0000 | Freq: Every day | NASAL | 5 refills | Status: AC | PRN

## 2024-05-24 MED ORDER — METHYLPREDNISOLONE 4 MG PO TBPK: ORAL_TABLET | ORAL | 0 refills | Status: DC

## 2024-05-24 MED ORDER — AIRSUPRA 90-80 MCG/ACT IN AERO: 2.0000 | INHALATION_SPRAY | RESPIRATORY_TRACT | 2 refills | Status: AC | PRN

## 2024-05-24 MED ORDER — MONTELUKAST SODIUM 10 MG PO TABS: 10.0000 mg | ORAL_TABLET | Freq: Every day | ORAL | 5 refills | Status: AC

## 2024-05-24 NOTE — Patient Instructions (Addendum)
 Asthma  Start medrol pak.  Daily controller medication(s): continue Symbicort 160mcg 2 puffs twice a day with spacer and rinse mouth afterwards. May use Airsupra rescue inhaler 2 puffs every 4 to 6 hours as needed for shortness of breath, chest tightness, coughing, and wheezing. Do not use more than 12 puffs in 24 hours. May use Airsupra rescue inhaler 2 puffs 5 to 15 minutes prior to strenuous physical activities. Rinse mouth after each use.  Coupon given. IF NOT COVERED LET US  KNOW. Monitor frequency of use - if you need to use it more than twice per week on a consistent basis let us  know.  OR May use albuterol  rescue inhaler 2 puffs or nebulizer every 4 to 6 hours as needed for shortness of breath, chest tightness, coughing, and wheezing. Monitor frequency of use - if you need to use it more than twice per week on a consistent basis let us  know.  If you need to use your albuterol  nebulizer machine back to back within 15-30 minutes with no relief then please go to the ER/urgent care for further evaluation.  Breathing control goals:  Full participation in all desired activities (may need albuterol  before activity) Albuterol  use two times or less a week on average (not counting use with activity) Cough interfering with sleep two times or less a month Oral steroids no more than once a year No hospitalizations   Reflux See handout for lifestyle and dietary modifications. Start omeprazole 40mg  once day for 1 month then stop - nothing to eat or drink for 20-30 minutes afterwards.   Rhinitis  Start Singulair  (montelukast ) 10mg  daily at night. Cautioned that in some children/adults can experience behavioral changes including hyperactivity, agitation, depression, sleep disturbances and suicidal ideations. These side effects are rare, but if you notice them you should notify me and discontinue Singulair  (montelukast ). Use over the counter antihistamines such as Zyrtec (cetirizine), Claritin  (loratadine), Allegra (fexofenadine), or Xyzal (levocetirizine) daily as needed. May take twice a day during allergy flares. May switch antihistamines every few months. Use Flonase (fluticasone) nasal spray 1-2 sprays per nostril once a day as needed for nasal congestion.  Nasal saline spray (i.e., Simply Saline) or nasal saline lavage (i.e., NeilMed) is recommended as needed and prior to medicated nasal sprays.  Hives Start zyrtec (cetirizine) 10mg  OR allegra (fexofenadine) 180mg  once a day. If symptoms are not controlled or causes drowsiness let us  know. Avoid the following potential triggers: alcohol, tight clothing, NSAIDs, hot showers and getting overheated. See below for proper skin care.   Follow up in 2 months or sooner if needed.   Skin care recommendations  Bath time: Always use lukewarm water. AVOID very hot or cold water. Keep bathing time to 5-10 minutes. Do NOT use bubble bath. Use a mild soap and use just enough to wash the dirty areas. Do NOT scrub skin vigorously.  After bathing, pat dry your skin with a towel. Do NOT rub or scrub the skin.  Moisturizers and prescriptions:  ALWAYS apply moisturizers immediately after bathing (within 3 minutes). This helps to lock-in moisture. Use the moisturizer several times a day over the whole body. Good summer moisturizers include: Aveeno, CeraVe, Cetaphil. Good winter moisturizers include: Aquaphor, Vaseline, Cerave, Cetaphil, Eucerin, Vanicream. When using moisturizers along with medications, the moisturizer should be applied about one hour after applying the medication to prevent diluting effect of the medication or moisturize around where you applied the medications. When not using medications, the moisturizer can be continued twice daily  as maintenance.  Laundry and clothing: Avoid laundry products with added color or perfumes. Use unscented hypo-allergenic laundry products such as Tide free, Cheer free & gentle, and All free  and clear.  If the skin still seems dry or sensitive, you can try double-rinsing the clothes. Avoid tight or scratchy clothing such as wool. Do not use fabric softeners or dyer sheets.

## 2024-06-05 ENCOUNTER — Ambulatory Visit (INDEPENDENT_AMBULATORY_CARE_PROVIDER_SITE_OTHER): Admitting: General Practice

## 2024-06-05 ENCOUNTER — Ambulatory Visit: Payer: Self-pay | Admitting: General Practice

## 2024-06-05 ENCOUNTER — Encounter: Payer: Self-pay | Admitting: General Practice

## 2024-06-05 VITALS — BP 118/80 | HR 79 | Temp 97.6°F | Ht 72.5 in | Wt 257.0 lb

## 2024-06-05 DIAGNOSIS — Z0000 Encounter for general adult medical examination without abnormal findings: Secondary | ICD-10-CM

## 2024-06-05 DIAGNOSIS — E6609 Other obesity due to excess calories: Secondary | ICD-10-CM | POA: Diagnosis not present

## 2024-06-05 DIAGNOSIS — Z1159 Encounter for screening for other viral diseases: Secondary | ICD-10-CM

## 2024-06-05 DIAGNOSIS — J4520 Mild intermittent asthma, uncomplicated: Secondary | ICD-10-CM

## 2024-06-05 DIAGNOSIS — E66811 Obesity, class 1: Secondary | ICD-10-CM | POA: Diagnosis not present

## 2024-06-05 DIAGNOSIS — Z7689 Persons encountering health services in other specified circumstances: Secondary | ICD-10-CM | POA: Diagnosis not present

## 2024-06-05 DIAGNOSIS — Z6834 Body mass index (BMI) 34.0-34.9, adult: Secondary | ICD-10-CM

## 2024-06-05 DIAGNOSIS — Z114 Encounter for screening for human immunodeficiency virus [HIV]: Secondary | ICD-10-CM

## 2024-06-05 LAB — LIPID PANEL
Cholesterol: 179 mg/dL (ref 0–200)
HDL: 44.3 mg/dL (ref >39.00)
LDL Cholesterol: 119 mg/dL — ABNORMAL HIGH (ref 0–99)
NonHDL: 134.95
Total CHOL/HDL Ratio: 4
Triglycerides: 80 mg/dL (ref 0.0–149.0)
VLDL: 16 mg/dL (ref 0.0–40.0)

## 2024-06-05 LAB — COMPREHENSIVE METABOLIC PANEL WITH GFR
ALT: 17 U/L (ref 0–53)
AST: 18 U/L (ref 0–37)
Albumin: 4.7 g/dL (ref 3.5–5.2)
Alkaline Phosphatase: 39 U/L (ref 39–117)
BUN: 16 mg/dL (ref 6–23)
CO2: 26 meq/L (ref 19–32)
Calcium: 9.6 mg/dL (ref 8.4–10.5)
Chloride: 106 meq/L (ref 96–112)
Creatinine, Ser: 1.05 mg/dL (ref 0.40–1.50)
GFR: 93.74 mL/min (ref >60.00)
Glucose, Bld: 91 mg/dL (ref 70–99)
Potassium: 4.2 meq/L (ref 3.5–5.1)
Sodium: 139 meq/L (ref 135–145)
Total Bilirubin: 0.6 mg/dL (ref 0.2–1.2)
Total Protein: 6.5 g/dL (ref 6.0–8.3)

## 2024-06-05 LAB — HEMOGLOBIN A1C: Hgb A1c MFr Bld: 5.6 % (ref 4.6–6.5)

## 2024-06-05 LAB — TSH: TSH: 0.81 u[IU]/mL (ref 0.35–5.50)

## 2024-06-05 NOTE — Assessment & Plan Note (Signed)
 EMR reviewed briefly.

## 2024-06-05 NOTE — Assessment & Plan Note (Signed)
 Discussed the importance of healthy diet and exercise to affect sustainable weight loss.

## 2024-06-05 NOTE — Assessment & Plan Note (Signed)
 Controlled.  Following with asthma clinic.  Reviewed notes from last visit.   Continue Symbicort, sinuglair, airsupra  PRN and albuterol  PRN.

## 2024-06-05 NOTE — Assessment & Plan Note (Signed)
 Immunizations UTD. Recommend pneumonia vaccine; he will discuss more with asthma doctor and will update.  Discussed the importance of a healthy diet and regular exercise in order for weight loss, and to reduce the risk of further co-morbidity.  Exam stable. Labs pending.  Follow up in 1 year for repeat physical.

## 2024-06-05 NOTE — Progress Notes (Signed)
 New Patient Office Visit  Subjective    Patient ID: Cesar Boyle, male    DOB: 1991/10/16  Age: 33 y.o. MRN: [id number]  CC:  Chief Complaint  Patient presents with   New Patient (Initial Visit)   Annual Exam    HPI Cesar Boyle is a 33 y.o. male presents to establish care, for complete physical and follow up of chronic conditions.  Previous PCP/physical/labs: [facility]. Physical last year. Blood work at the hospital recently.  Asthma: diagnosed many years ago. Following with allergy and asthma. Currently managed on  Symbicort 160 mg 2 puffs twice a day; Airsupra  or Albuterol  as needed and Singulair  10 mg once daily. Has bene using airpsupra daily as of now but reports improvement over. He denies any shortness of breath, chest pain or difficulty breathing. Diagnosed with a rare form of sleep apnea but is controlled. Was not able to get CPAP through insurance but he is doing well. He does still snoring. He is watching his diet and exercising to help with weight loss.  Immunizations: -Tetanus: Completed in 2021 -Pneumonia: due; declines today but will complete at next visit.  Diet: Fair diet.  Exercise:  regular exercise.  Eye exam: Completes annually Dental exam: Completes semi-annually    Family hx of lung cancer- father. Deceased. Fam hx of ovarian cancer - paternal grandmother. Paternal uncle - type 2 DM.  Outpatient Encounter Medications as of 06/05/2024  Medication Sig   albuterol  (PROVENTIL ) (2.5 MG/3ML) 0.083% nebulizer solution Take 3 mLs (2.5 mg total) by nebulization every 4 (four) hours as needed for wheezing or shortness of breath (coughing fits).   Albuterol -Budesonide (AIRSUPRA ) 90-80 MCG/ACT AERO Inhale 2 puffs into the lungs every 4 (four) hours as needed (coughing, wheezing, chest tightness). Do not exceed 12 puffs in 24 hours.   fluticasone  (FLONASE ) 50 MCG/ACT nasal spray Place 1-2 sprays into both nostrils daily as needed (nasal  congestion).   montelukast  (SINGULAIR ) 10 MG tablet Take 1 tablet (10 mg total) by mouth at bedtime.   omeprazole  (PRILOSEC) 40 MG capsule Take 1 capsule (40 mg total) by mouth daily.   SYMBICORT 160-4.5 MCG/ACT inhaler Inhale 2 puffs into the lungs 2 (two) times daily.   albuterol  (VENTOLIN  HFA) 108 (90 Base) MCG/ACT inhaler Inhale 2 puffs into the lungs every 6 (six) hours as needed for wheezing or shortness of breath. (Patient not taking: Reported on 06/05/2024)   [DISCONTINUED] methylPREDNISolone  (MEDROL  DOSEPAK) 4 MG TBPK tablet Take 6 tablets on day 1, 5 tablets on day 2, 4 tabs on day 3, 3 tabs on day 4, 2 tabs on day 5, 1 tab on day 6.   No facility-administered encounter medications on file as of 06/05/2024.    Past Medical History:  Diagnosis Date   ADD (attention deficit disorder)    Allergy    Asthma    Depression    Eczema    Migraine headache    Substance abuse (HCC)     Past Surgical History:  Procedure Laterality Date   HERNIA REPAIR  age [age]   double   TONSILLECTOMY AND ADENOIDECTOMY      Family History  Problem Relation Age of Onset   Allergic rhinitis Mother    Cancer Father    Diabetes Other        1st degree relative   Hyperlipidemia Other    Cancer Other        lung, prostate   Stroke Other  Heart disease Other     Social History   Socioeconomic History   Marital status: Married    Spouse name: Not on file   Number of children: Not on file   Years of education: Not on file   Highest education level: Not on file  Occupational History   Occupation: Student  Tobacco Use   Smoking status: Former    Types: Cigarettes    Passive exposure: Past   Smokeless tobacco: Former   Tobacco comments:    quit 2011  Vaping Use   Vaping status: Former  Substance and Sexual Activity   Alcohol use: Yes    Comment: occassionally   Drug use: Yes    Frequency: 7.0 times per week    Types: Marijuana    Comment: former   Sexual activity: Not on file     Comment: Heroin  Other Topics Concern   Not on file  Social History Narrative   Single   Regular exercise-yes   Some green   Social Drivers of Corporate investment banker Strain: Not on file  Food Insecurity: Not on file  Transportation Needs: Not on file  Physical Activity: Not on file  Stress: Not on file  Social Connections: Not on file  Intimate Partner Violence: Not on file    Review of Systems  Constitutional:  Negative for chills, fever, malaise/fatigue and weight loss.  HENT:  Negative for congestion, ear discharge, ear pain, hearing loss, nosebleeds, sinus pain, sore throat and tinnitus.   Eyes:  Negative for blurred vision, double vision, pain, discharge and redness.  Respiratory:  Negative for cough, shortness of breath, wheezing and stridor.   Cardiovascular:  Negative for chest pain, palpitations and leg swelling.  Gastrointestinal:  Negative for abdominal pain, constipation, diarrhea, heartburn, nausea and vomiting.  Genitourinary:  Negative for dysuria, frequency and urgency.  Musculoskeletal:  Negative for myalgias.  Skin:  Negative for rash.  Neurological:  Negative for dizziness, tingling, seizures, weakness and headaches.  Psychiatric/Behavioral:  Negative for depression, substance abuse and suicidal ideas. The patient is not nervous/anxious.         Objective    BP 118/80   Pulse 79   Temp 97.6 F (36.4 C) (Temporal)   Ht 6' 0.5 (1.842 m)   Wt 257 lb (116.6 kg)   SpO2 96%   BMI 34.38 kg/m   Physical Exam Vitals and nursing note reviewed.  Constitutional:      Appearance: Normal appearance.  HENT:     Head: Normocephalic and atraumatic.     Right Ear: Tympanic membrane, ear canal and external ear normal.     Left Ear: Tympanic membrane, ear canal and external ear normal.     Nose: Nose normal.     Mouth/Throat:     Mouth: Mucous membranes are moist.     Pharynx: Oropharynx is clear.  Eyes:     Conjunctiva/sclera: Conjunctivae normal.      Pupils: Pupils are equal, round, and reactive to light.  Cardiovascular:     Rate and Rhythm: Normal rate and regular rhythm.     Pulses: Normal pulses.     Heart sounds: Normal heart sounds.  Pulmonary:     Effort: Pulmonary effort is normal.     Breath sounds: Normal breath sounds.  Abdominal:     General: Abdomen is flat. Bowel sounds are normal.     Palpations: Abdomen is soft.  Musculoskeletal:        General: Normal range  of motion.     Cervical back: Normal range of motion.  Skin:    General: Skin is warm and dry.     Capillary Refill: Capillary refill takes less than 2 seconds.  Neurological:     General: No focal deficit present.     Mental Status: He is alert and oriented to person, place, and time. Mental status is at baseline.  Psychiatric:        Mood and Affect: Mood normal.        Behavior: Behavior normal.        Thought Content: Thought content normal.        Judgment: Judgment normal.         Assessment & Plan:  Encounter for screening and preventative care Assessment & Plan: Immunizations UTD. Recommend pneumonia vaccine; he will discuss more with asthma doctor and will update.  Discussed the importance of a healthy diet and regular exercise in order for weight loss, and to reduce the risk of further co-morbidity.  Exam stable. Labs pending.  Follow up in 1 year for repeat physical.   Orders: -     Lipid panel -     TSH -     Comprehensive metabolic panel with GFR -     Hemoglobin A1c  Establishing care with new doctor, encounter for Assessment & Plan: EMR reviewed briefly.    Screening for HIV (human immunodeficiency virus) -     HIV Antibody (routine testing w rflx)  Encounter for hepatitis C screening test for low risk patient -     Hepatitis C antibody  Class 1 obesity due to excess calories with body mass index (BMI) of 34.0 to 34.9 in adult, unspecified whether serious comorbidity present Assessment & Plan: Discussed the  importance of healthy diet and exercise to affect sustainable weight loss.   Orders: -     Lipid panel -     TSH -     Comprehensive metabolic panel with GFR -     Hemoglobin A1c  Mild intermittent asthma without complication Assessment & Plan: Controlled.  Following with asthma clinic.  Reviewed notes from last visit.   Continue Symbicort, sinuglair, airsupra  PRN and albuterol  PRN.       Return in about 1 year (around 06/05/2025) for physical.   [name], NP

## 2024-06-05 NOTE — Patient Instructions (Addendum)
 Stop by the lab prior to leaving today. I will notify you of your results once received.   Continue to monitor diet and exercise.  Discuss pneumonia vaccine with Asthma doctor.  It was a pleasure to meet you today! Please don't hesitate to contact me with any questions. Welcome to [facility]!

## 2024-06-06 LAB — HEPATITIS C ANTIBODY: Hepatitis C Ab: NONREACTIVE

## 2024-06-06 LAB — HIV ANTIBODY (ROUTINE TESTING W REFLEX): HIV 1&2 Ab, 4th Generation: NONREACTIVE

## 2024-07-28 ENCOUNTER — Other Ambulatory Visit: Payer: Self-pay | Admitting: Allergy

## 2024-07-29 ENCOUNTER — Ambulatory Visit (INDEPENDENT_AMBULATORY_CARE_PROVIDER_SITE_OTHER): Admitting: General Practice

## 2024-07-29 ENCOUNTER — Encounter: Payer: Self-pay | Admitting: General Practice

## 2024-07-29 VITALS — BP 120/86 | HR 106 | Temp 97.3°F | Ht 72.5 in | Wt 265.0 lb

## 2024-07-29 DIAGNOSIS — L989 Disorder of the skin and subcutaneous tissue, unspecified: Secondary | ICD-10-CM | POA: Diagnosis not present

## 2024-07-29 DIAGNOSIS — N469 Male infertility, unspecified: Secondary | ICD-10-CM | POA: Insufficient documentation

## 2024-07-29 NOTE — Progress Notes (Signed)
 Established Patient Office Visit  Subjective   Patient ID: Cesar Boyle, male    DOB: [birth year]  Age: 33 y.o. MRN: [id number]  Chief Complaint  Patient presents with   fertility    Wants to discuss options    HPI  Cesar Boyle is a 33 year old male with past medical history of migraine, asthma, add, depression, obesity presents today for an acute visit.   Discussed the use of AI scribe software for clinical note transcription with the patient, who gave verbal consent to proceed.  History of Present Illness He has been attempting to conceive with his partner for approximately one year without success. His partner has a history of endometriosis and has undergone surgery for it. He is uncertain about the timing of ovulation, which may be affecting his ability to conceive.  He experiences slight, occasional problems with erections, which he attributes to aging and a decrease in stamina and drive. He does not consider these issues significant or consistent enough to require medication.   His left testicle is slightly enlarged, a condition he first noticed two years ago after lifting a heavy door, which resulted in pain for about a week. He consulted a urologist at that time, who did not find any significant issues.  He has a history of eczema, which he experienced as a child, and recently noticed similar symptoms on his arms. He describes them as small, skin-colored bumps that itch and resemble insect bites.   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 07/29/2024   Skin lesions 07/29/2024   Establishing care with new doctor, encounter for 06/05/2024   Encounter for screening and preventative care 06/05/2024   Class 1 obesity due to excess calories with body mass index (BMI) of 34.0 to 34.9 in adult 06/05/2024   Asthma 10/05/2011   ADD 10/07/2010   DEPRESSION 09/16/2010   MIGRAINE HEADACHE 09/16/2010   Past Medical History:  Diagnosis Date    ADD (attention deficit disorder)    Allergy    Asthma    Depression    Eczema    History of chickenpox    Migraine headache    Substance abuse (HCC)    Past Surgical History:  Procedure Laterality Date   HERNIA REPAIR  age [age]   double   TONSILLECTOMY AND ADENOIDECTOMY     Allergies  Allergen Reactions   Ceclor [Cefaclor] Rash    Childhood allergy         07/29/2024    3:52 PM 06/05/2024   10:40 AM  Depression screen PHQ 2/9  Decreased Interest 0 0  Down, Depressed, Hopeless 0 0  PHQ - 2 Score 0 0  Altered sleeping 0 0  Tired, decreased energy 0 1  Change in appetite 0 0  Feeling bad or failure about yourself  0 0  Trouble concentrating 0 2  Moving slowly or fidgety/restless 0 0  Suicidal thoughts 0 0  PHQ-9 Score 0 3  Difficult doing work/chores Not difficult at all Not difficult at all       06/05/2024   10:40 AM  GAD 7 : Generalized Anxiety Score  Nervous, Anxious, on Edge 0  Control/stop worrying 0  Worry too much - different things 0  Trouble relaxing 0  Restless 1  Easily annoyed or irritable 1  Afraid - awful might happen 0  Total GAD 7 Score 2  Anxiety Difficulty Not difficult at all  Review of Systems  Constitutional:  Negative for chills and fever.  Respiratory:  Negative for shortness of breath.   Cardiovascular:  Negative for chest pain.  Gastrointestinal:  Negative for abdominal pain, constipation, diarrhea, heartburn, nausea and vomiting.  Genitourinary:  Negative for dysuria, frequency and urgency.  Neurological:  Negative for dizziness and headaches.  Endo/Heme/Allergies:  Negative for polydipsia.  Psychiatric/Behavioral:  Negative for depression and suicidal ideas. The patient is not nervous/anxious.       Objective:     BP 120/86   Pulse (!) 106   Temp (!) 97.3 F (36.3 C) (Temporal)   Ht 6' 0.5 (1.842 m)   Wt 265 lb (120.2 kg)   SpO2 98%   BMI 35.45 kg/m  BP Readings from Last 3 Encounters:  07/29/24 120/86   06/05/24 118/80  05/24/24 120/88   Wt Readings from Last 3 Encounters:  07/29/24 265 lb (120.2 kg)  06/05/24 257 lb (116.6 kg)  05/24/24 254 lb 12.8 oz (115.6 kg)      Physical Exam Vitals and nursing note reviewed.  Constitutional:      Appearance: Normal appearance.  Cardiovascular:     Rate and Rhythm: Normal rate and regular rhythm.     Pulses: Normal pulses.     Heart sounds: Normal heart sounds.  Pulmonary:     Effort: Pulmonary effort is normal.     Breath sounds: Normal breath sounds.  Skin:     Neurological:     Mental Status: He is alert and oriented to person, place, and time.  Psychiatric:        Mood and Affect: Mood normal.        Behavior: Behavior normal.        Thought Content: Thought content normal.        Judgment: Judgment normal.      No results found for any visits on 07/29/24.     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 Partner stable post-endometriosis surgery. Occasional erectile dysfunction.  - Referral placed for Urology. - Advise monitoring for symptoms such as pain, bleeding, or discharge.  Eczema, unspecified Recurrent pruritic spots on arms, resembling childhood eczema. Lesions are small, skin-colored, exacerbated by scratching. - Recommend over-the-counter hydrocortisone for two weeks to manage pruritus and inflammation. - Advise avoiding scratching affected areas. - Suggest Zyrtec if pruritus persists. - Instruct to send a message if symptoms do not improve after two weeks of treatment.    Return if symptoms worsen or fail to improve.    [name], NP

## 2024-07-29 NOTE — Patient Instructions (Signed)
 You will either be contacted via phone regarding your referral to urologist , or you may receive a letter on your MyChart portal from our referral team with instructions for scheduling an appointment. Please let us  know if you have not been contacted by anyone within two weeks.  It was a pleasure to see you today!

## 2024-08-01 NOTE — Progress Notes (Deleted)
 Follow Up Note  RE: Cesar Boyle MRN: [id number] DOB: 08/20/1991 Date of Office Visit: 08/02/2024  Referring provider: Valentin Skates, DO Primary care provider: Vincente Shivers, NP  Chief Complaint: No chief complaint on file.  History of Present Illness: I had the pleasure of seeing Cesar Boyle for a follow up visit at the [facility] on 08/02/2024. He is a 33 y.o. male, who is being followed for asthma, allergic rhinitis, urticaria, GERD. His previous allergy office visit was on 05/24/2024 with Dr. Luke. Today is a regular follow up visit.  Discussed the use of AI scribe software for clinical note transcription with the patient, who gave verbal consent to proceed.  History of Present Illness            ***  Assessment and Plan: Cesar Boyle is a 33 y.o. male with: Not well controlled moderate persistent asthma with acute exacerbation Recently restarted Symbicort and was off daily inhalers due to insurance and cost. Using albuterol  throughout the day to manage symptoms. Last prednisone  about 1 month ago. Still having daily symptoms.  Today's spirometry showed mild obstructive disease with no improvement in FEV1 post bronchodilator treatment. Clinically feeling slightly improved.  Start medrol  pak.  Daily controller medication(s): continue Symbicort 160mcg 2 puffs twice a day with spacer and rinse mouth afterwards. May use Airsupra  rescue inhaler 2 puffs every 4 to 6 hours as needed for shortness of breath, chest tightness, coughing, and wheezing. Do not use more than 12 puffs in 24 hours. May use Airsupra  rescue inhaler 2 puffs 5 to 15 minutes prior to strenuous physical activities. Rinse mouth after each use.  Coupon given. IF NOT COVERED LET US  KNOW. Monitor frequency of use - if you need to use it more than twice per week on a consistent basis let us  know.  OR May use albuterol  rescue inhaler 2 puffs or nebulizer every 4 to 6 hours as needed for shortness of  breath, chest tightness, coughing, and wheezing. Monitor frequency of use - if you need to use it more than twice per week on a consistent basis let us  know.  If you need to use your albuterol  nebulizer machine back to back within 15-30 minutes with no relief then please go to the ER/urgent care for further evaluation.  Get spirometry at next visit. If no improvement, consider biologics next.    Other allergic rhinitis Exacerbated by grass and dust. Works in [employer]. Start Singulair  (montelukast ) 10mg  daily at night. Cautioned that in some children/adults can experience behavioral changes including hyperactivity, agitation, depression, sleep disturbances and suicidal ideations. These side effects are rare, but if you notice them you should notify me and discontinue Singulair  (montelukast ). Use over the counter antihistamines such as Zyrtec (cetirizine), Claritin (loratadine), Allegra (fexofenadine), or Xyzal (levocetirizine) daily as needed. May take twice a day during allergy flares. May switch antihistamines every few months. Use Flonase  (fluticasone ) nasal spray 1-2 sprays per nostril once a day as needed for nasal congestion.  Nasal saline spray (i.e., Simply Saline) or nasal saline lavage (i.e., NeilMed) is recommended as needed and prior to medicated nasal sprays. Plan on allergy testing once asthma is under better control.    Urticaria Flares with cold weather. Start zyrtec (cetirizine) 10mg  OR allegra (fexofenadine) 180mg  once a day. If symptoms are not controlled or causes drowsiness let us  know. Avoid the following potential triggers: alcohol, tight clothing, NSAIDs, hot showers and getting overheated. See below for proper skin care.  Gastroesophageal reflux disease, unspecified whether esophagitis present Intermittent GERD symptoms potentially contributing to nocturnal asthma symptoms. Discussed impact of reflux on asthma symptoms. See handout for lifestyle and dietary  modifications. Start omeprazole  40mg  once day for 1 month then stop - nothing to eat or drink for 20-30 minutes afterwards.    Assessment and Plan              No follow-ups on file.  No orders of the defined types were placed in this encounter.  Lab Orders  No laboratory test(s) ordered today    Diagnostics: Spirometry:  Tracings reviewed. His effort: {Blank single:19197::Good reproducible efforts.,It was hard to get consistent efforts and there is a question as to whether this reflects a maximal maneuver.,Poor effort, data can not be interpreted.} FVC: ***L FEV1: ***L, ***% predicted FEV1/FVC ratio: ***% Interpretation: {Blank single:19197::Spirometry consistent with mild obstructive disease,Spirometry consistent with moderate obstructive disease,Spirometry consistent with severe obstructive disease,Spirometry consistent with possible restrictive disease,Spirometry consistent with mixed obstructive and restrictive disease,Spirometry uninterpretable due to technique,Spirometry consistent with normal pattern,No overt abnormalities noted given today's efforts}.  Please see scanned spirometry results for details.  Skin Testing: {Blank single:19197::Select foods,Environmental allergy panel,Environmental allergy panel and select foods,Food allergy panel,None,Deferred due to recent antihistamines use}. *** Results discussed with patient/family.   Medication List:  Current Outpatient Medications  Medication Sig Dispense Refill   albuterol  (PROVENTIL ) (2.5 MG/3ML) 0.083% nebulizer solution Take 3 mLs (2.5 mg total) by nebulization every 4 (four) hours as needed for wheezing or shortness of breath (coughing fits). 75 mL 1   albuterol  (VENTOLIN  HFA) 108 (90 Base) MCG/ACT inhaler Inhale 2 puffs into the lungs every 6 (six) hours as needed for wheezing or shortness of breath. 8 g 2   Albuterol -Budesonide (AIRSUPRA ) 90-80 MCG/ACT AERO Inhale 2 puffs into  the lungs every 4 (four) hours as needed (coughing, wheezing, chest tightness). Do not exceed 12 puffs in 24 hours. 10.7 g 2   fluticasone  (FLONASE ) 50 MCG/ACT nasal spray Place 1-2 sprays into both nostrils daily as needed (nasal congestion). 16 g 5   montelukast  (SINGULAIR ) 10 MG tablet Take 1 tablet (10 mg total) by mouth at bedtime. 30 tablet 5   omeprazole  (PRILOSEC) 40 MG capsule TAKE 1 CAPSULE (40 MG TOTAL) BY MOUTH DAILY. 30 capsule 1   SYMBICORT 160-4.5 MCG/ACT inhaler Inhale 2 puffs into the lungs 2 (two) times daily.     No current facility-administered medications for this visit.   Allergies: Allergies  Allergen Reactions   Ceclor [Cefaclor] Rash    Childhood allergy   I reviewed his past medical history, social history, family history, and environmental history and no significant changes have been reported from his previous visit.  Review of Systems  Constitutional:  Negative for appetite change, chills, fever and unexpected weight change.  HENT:  Negative for congestion and rhinorrhea.   Eyes:  Negative for itching.  Respiratory:  Positive for cough, chest tightness, shortness of breath and wheezing.   Cardiovascular:  Negative for chest pain.  Gastrointestinal:  Negative for abdominal pain.  Genitourinary:  Negative for difficulty urinating.  Skin:  Negative for rash.  Neurological:  Negative for headaches.    Objective: There were no vitals taken for this visit. There is no height or weight on file to calculate BMI. Physical Exam Vitals and nursing note reviewed.  Constitutional:      Appearance: Normal appearance. He is well-developed.  HENT:     Head: Normocephalic and atraumatic.  Right Ear: Tympanic membrane and external ear normal.     Left Ear: Tympanic membrane and external ear normal.     Nose: Nose normal.     Mouth/Throat:     Mouth: Mucous membranes are moist.     Pharynx: Oropharynx is clear.  Eyes:     Conjunctiva/sclera: Conjunctivae  normal.  Cardiovascular:     Rate and Rhythm: Normal rate and regular rhythm.     Heart sounds: Normal heart sounds. No murmur heard.    No friction rub. No gallop.  Pulmonary:     Effort: Pulmonary effort is normal.     Breath sounds: Normal breath sounds. No wheezing, rhonchi or rales.  Musculoskeletal:     Cervical back: Neck supple.  Skin:    General: Skin is warm.     Findings: No rash.  Neurological:     Mental Status: He is alert and oriented to person, place, and time.  Psychiatric:        Behavior: Behavior normal.    Previous notes and tests were reviewed. The plan was reviewed with the patient/family, and all questions/concerned were addressed.  It was my pleasure to see Cesar Boyle today and participate in his care. Please feel free to contact me with any questions or concerns.  Sincerely,  Orlan Cramp, DO Allergy & Immunology  [facility]  [facility] office: [phone] [facility] office: [phone]

## 2024-08-02 ENCOUNTER — Ambulatory Visit: Payer: Self-pay | Admitting: Allergy

## 2024-08-02 DIAGNOSIS — J309 Allergic rhinitis, unspecified: Secondary | ICD-10-CM

## 2024-08-02 NOTE — Progress Notes (Deleted)
   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      PMH: Past Medical History:  Diagnosis Date   ADD (attention deficit disorder)    Allergy    Asthma    Depression    Eczema    History of chickenpox    Migraine headache    Substance abuse (HCC)     Surgical History: Past Surgical History:  Procedure Laterality Date   HERNIA REPAIR  age [age]   double   TONSILLECTOMY AND ADENOIDECTOMY      Family History: Family History  Problem Relation Age of Onset   Hyperlipidemia Mother    Arthritis Mother    Allergic rhinitis Mother    Hearing loss Father    Early death Father    Cancer Father    Hypertension Maternal Grandmother    Hyperlipidemia Maternal Grandmother    Stroke Maternal Grandfather    Heart disease Maternal Grandfather    Hypertension Maternal Grandfather    Hyperlipidemia Maternal Grandfather    Heart attack Maternal Grandfather    Cancer Paternal Grandmother    Diabetes Other        1st degree relative   Hyperlipidemia Other    Cancer Other        lung, prostate   Stroke Other    Heart disease Other     Social History:  reports that he has quit smoking. His smoking use included cigarettes. He has been exposed to tobacco smoke. He has quit using smokeless tobacco. He reports current alcohol use. He reports current drug use. Frequency: 7.00 times per week. Drug: Marijuana.    Physical Exam: There were no vitals taken for this visit.   Constitutional:  Alert and oriented, No acute distress. Cardiovascular: No clubbing, cyanosis, or edema. Respiratory: Normal respiratory effort, no increased work of breathing. GI: Nondistended GU: *** Skin: No rashes, bruises or suspicious lesions. Neurologic: Grossly intact, no focal deficits, moving all 4 extremities. Psychiatric: Normal mood and affect.  Laboratory Data: ***   Pertinent  Imaging: I have personally viewed and interpreted the ***.   Assessment & Plan:    There are no diagnoses linked to this encounter.    [name], MD 08/02/2024  [facility] [address], [address] [place], [place] [address] [phone]

## 2024-08-06 ENCOUNTER — Encounter: Payer: Self-pay | Admitting: Urology

## 2024-08-07 ENCOUNTER — Ambulatory Visit: Admitting: Urology

## 2024-09-09 ENCOUNTER — Telehealth: Payer: Self-pay

## 2024-09-09 NOTE — Telephone Encounter (Signed)
 [place] with [facility] said pt has splinter in palm of hand since 09/06/24. Splinter is approx 1/4 long and pt cannot get a hold of splinter to pull it out; no available appt 09/09/24 or 09/10/24 at [facility]; [facility] said she will have pt go to [facility] to have splinter removed.sending note to KATHEE Aurora NP.

## 2024-09-10 ENCOUNTER — Ambulatory Visit: Admitting: General Practice

## 2024-09-11 ENCOUNTER — Ambulatory Visit: Admitting: General Practice

## 2024-11-07 ENCOUNTER — Ambulatory Visit: Payer: Self-pay

## 2024-11-07 NOTE — Telephone Encounter (Signed)
 PCP out of office; appt scheduled with outside provider tomorrow. Sending to PCP as fyi.

## 2024-11-07 NOTE — Telephone Encounter (Signed)
 FYI Only or Action Required?: FYI only for provider: appointment scheduled on 11/08/24.  Patient was last seen in primary care on 07/29/2024 by Vincente Shivers, NP.  Called Nurse Triage reporting Cough.  Symptoms began several days ago.  Interventions attempted: OTC medications: Tylenol.  Symptoms are: stable.  Triage Disposition: See Physician Within 24 Hours  Patient/caregiver understands and will follow disposition?: Yes Reason for Disposition  [1] Fever returns after gone for over 24 hours AND [2] symptoms worse or not improved  Answer Assessment - Initial Assessment Questions Patient reports having a bad tooth that might be infected and wants to r/o the flu. Using inhaler quiet a bit. No availability at PCP office, scheduled tomorrow at [facility].  1. ONSET: When did the cough begin?      Few days ago  2. SEVERITY: How bad is the cough today?      Somewhat severe since yesterday  3. SPUTUM: Describe the color of your sputum (e.g., none, dry cough; clear, white, yellow, green)     green  4. DIFFICULTY BREATHING: Are you having difficulty breathing? If Yes, ask: How bad is it? (e.g., mild, moderate, severe)      Mild, not any more than normal due to asthma  5. FEVER: Do you have a fever? If Yes, ask: What is your temperature, how was it measured, and when did it start?     100.7, taken tylenol dropped to 100.1  6. CARDIAC HISTORY: Do you have any history of heart disease? (e.g., heart attack, congestive heart failure)      Denies  7. LUNG HISTORY: Do you have any history of lung disease?  (e.g., pulmonary embolus, asthma, emphysema)     Asthma  10. OTHER SYMPTOMS: Do you have any other symptoms? (e.g., runny nose, wheezing, chest pain)       Sore throat, body aches, pain in throat from coughing  Protocols used: Cough - Acute Productive-A-AH  Copied from CRM #[id number]. Topic: Clinical - Red Word Triage >> Nov 07, 2024  9:39 AM Joesph NOVAK  wrote: Red Word that prompted transfer to Nurse Triage: Flu like symptoms, patient checked temp today (100.4) coughing, sore throat, discolored mucous (green), headache,

## 2024-11-08 ENCOUNTER — Ambulatory Visit
Admission: RE | Admit: 2024-11-08 | Discharge: 2024-11-08 | Disposition: A | Source: Ambulatory Visit | Attending: Family Medicine | Admitting: Family Medicine

## 2024-11-08 ENCOUNTER — Ambulatory Visit (INDEPENDENT_AMBULATORY_CARE_PROVIDER_SITE_OTHER): Admitting: Family Medicine

## 2024-11-08 ENCOUNTER — Ambulatory Visit
Admission: RE | Admit: 2024-11-08 | Discharge: 2024-11-08 | Disposition: A | Attending: Family Medicine | Admitting: Family Medicine

## 2024-11-08 ENCOUNTER — Ambulatory Visit: Payer: Self-pay | Admitting: Family Medicine

## 2024-11-08 ENCOUNTER — Encounter: Payer: Self-pay | Admitting: Family Medicine

## 2024-11-08 VITALS — BP 118/70 | HR 83 | Temp 98.5°F | Resp 16 | Ht 72.0 in | Wt 268.0 lb

## 2024-11-08 DIAGNOSIS — J45901 Unspecified asthma with (acute) exacerbation: Secondary | ICD-10-CM

## 2024-11-08 DIAGNOSIS — R051 Acute cough: Secondary | ICD-10-CM

## 2024-11-08 DIAGNOSIS — R0602 Shortness of breath: Secondary | ICD-10-CM | POA: Diagnosis not present

## 2024-11-08 DIAGNOSIS — U071 COVID-19: Secondary | ICD-10-CM | POA: Diagnosis not present

## 2024-11-08 DIAGNOSIS — R52 Pain, unspecified: Secondary | ICD-10-CM | POA: Diagnosis not present

## 2024-11-08 DIAGNOSIS — J029 Acute pharyngitis, unspecified: Secondary | ICD-10-CM

## 2024-11-08 DIAGNOSIS — J101 Influenza due to other identified influenza virus with other respiratory manifestations: Secondary | ICD-10-CM | POA: Diagnosis not present

## 2024-11-08 DIAGNOSIS — R509 Fever, unspecified: Secondary | ICD-10-CM

## 2024-11-08 LAB — POC COVID19/FLU A&B COMBO
Covid Antigen, POC: POSITIVE — AB
Influenza A Antigen, POC: NEGATIVE
Influenza B Antigen, POC: POSITIVE — AB

## 2024-11-08 LAB — POCT RAPID STREP A (OFFICE): Rapid Strep A Screen: NEGATIVE

## 2024-11-08 MED ORDER — ALBUTEROL SULFATE HFA 108 (90 BASE) MCG/ACT IN AERS: 2.0000 | INHALATION_SPRAY | Freq: Four times a day (QID) | RESPIRATORY_TRACT | 0 refills | Status: AC | PRN

## 2024-11-08 MED ORDER — NIRMATRELVIR/RITONAVIR (PAXLOVID)TABLET: 3.0000 | ORAL_TABLET | Freq: Two times a day (BID) | ORAL | 0 refills | Status: AC

## 2024-11-08 MED ORDER — ALBUTEROL SULFATE (2.5 MG/3ML) 0.083% IN NEBU: 2.5000 mg | INHALATION_SOLUTION | RESPIRATORY_TRACT | 0 refills | Status: AC | PRN

## 2024-11-08 NOTE — Progress Notes (Signed)
 Acute Office Visit  Subjective:     Patient ID: Cesar Boyle, male    DOB: [birth year], 33 y.o.   MRN: [id number]  Chief Complaint  Patient presents with   Sore Throat    X2 days   Cough    X3 days, Non-productive   Generalized Body Aches    HPI Patient is in today for complaints of sore throat, cough, headache, and bodyaches. He is a new patient to me. He reports fever daily since symptom onset 3 days ago. Tmax 101. He does endorse associated headaches. He also endorses sinus congestion and sinus pressure. He denies nausea, vomiting or diarrhea.  He does endorse shortness of breath requiring use of rescue inhaler multiple times daily in the last 3 days. Typically use is once or twice daily. Asthma appears to be poorly controlled requiring use of rescue inhaler daily without acute illness, however with acute viral illnesses asthma is increasingly uncontrolled.  Diagnosed with asthma and asthma managed with Symbicort and Montelukast . He reports he had seen an allergist in the last year who started him on Airsupra , however he felt that this was not as beneficial as Albuterol  (Ventolin ).    Review of Systems  Constitutional:  Positive for chills and fever.  HENT:  Positive for congestion and sore throat.   Respiratory:  Positive for cough and shortness of breath.   Gastrointestinal:  Negative for diarrhea, nausea and vomiting.        Objective:    BP 118/70   Pulse 83   Temp 98.5 F (36.9 C)   Resp 16   Ht 6' (1.829 m)   Wt 268 lb (121.6 kg)   SpO2 99%   BMI 36.35 kg/m  BP Readings from Last 3 Encounters:  11/08/24 118/70  07/29/24 120/86  06/05/24 118/80      Physical Exam Constitutional:      Appearance: He is obese. He is not toxic-appearing or diaphoretic.  Cardiovascular:     Rate and Rhythm: Normal rate and regular rhythm.     Heart sounds: Normal heart sounds.  Pulmonary:     Effort: Pulmonary effort is normal. No respiratory distress.     Breath  sounds: Normal breath sounds. No stridor. No wheezing, rhonchi or rales.  Skin:    General: Skin is warm and dry.  Neurological:     General: No focal deficit present.     Mental Status: He is alert.  Psychiatric:        Mood and Affect: Mood normal.        Behavior: Behavior normal. Behavior is cooperative.     Results for orders placed or performed in visit on 11/08/24  POCT rapid strep A  Result Value Ref Range   Rapid Strep A Screen Negative Negative  POC Covid19/Flu A&B Antigen  Result Value Ref Range   Influenza A Antigen, POC Negative Negative   Influenza B Antigen, POC Positive (A) Negative   Covid Antigen, POC Positive (A) Negative        Assessment & Plan:   1. Shortness of breath (Primary) Patient presents with complaints of several symptoms including shortness of breath. Rapid flu B testing positive, rapid COVID testing positive. Documented asthma diagnosis poorly controlled at baseline per patient report requiring rescue inhaler use daily, however since onset of symptoms/acute illness he has required use of rescue inhaler multiple times daily.  -Chest x-ray ordered due to asthma diagnosis, increased shortness of breath and COVID and flu B diagnoses  to rule out complications such as pneumonia that would require antibiotic therapy.  -Paxlovid  started due to COVID positive testing and asthma diagnosis.  -Patient advised to follow up with PCP in 6 weeks for asthma management. He is agreeable. -Red flag symptoms/ED precautions advised. He voices understanding.  - DG Chest 2 View; Future - albuterol  (PROVENTIL ) (2.5 MG/3ML) 0.083% nebulizer solution; Take 3 mLs (2.5 mg total) by nebulization every 4 (four) hours as needed for wheezing or shortness of breath (coughing fits).  Dispense: 75 mL; Refill: 0 - albuterol  (VENTOLIN  HFA) 108 (90 Base) MCG/ACT inhaler; Inhale 2 puffs into the lungs every 6 (six) hours as needed for wheezing or shortness of breath.  Dispense: 8 g;  Refill: 0  2. Exacerbation of asthma, unspecified asthma severity, unspecified whether persistent Asthma exacerbation s/t viral illnesses, COVID and flu B. Rescue inhaler and neb solution refilled.  -Advised follow up with PCP in 6 weeks.  -Red flag symptoms/ ED precautions advised.  - albuterol  (PROVENTIL ) (2.5 MG/3ML) 0.083% nebulizer solution; Take 3 mLs (2.5 mg total) by nebulization every 4 (four) hours as needed for wheezing or shortness of breath (coughing fits).  Dispense: 75 mL; Refill: 0 - albuterol  (VENTOLIN  HFA) 108 (90 Base) MCG/ACT inhaler; Inhale 2 puffs into the lungs every 6 (six) hours as needed for wheezing or shortness of breath.  Dispense: 8 g; Refill: 0  3. COVID Patient presented with multitude of symptoms and rapid COVID testing is positive.  -COVID isolation precautions advised.  -Paxlovid  therapy prescribed due to asthma diagnosis and to assist with symptom relief.  - nirmatrelvir /ritonavir  (PAXLOVID ) 20 x 150 MG & 10 x 100MG  TABS; Take 3 tablets by mouth 2 (two) times daily for 5 days. (Take nirmatrelvir  150 mg two tablets twice daily for 5 days and ritonavir  100 mg one tablet twice daily for 5 days) Patient GFR is 93.  Dispense: 30 tablet; Refill: 0  4. Influenza B Rapid flu testing positive for flu B.  -Continue symptom management.   5. Sore throat Sore throat s/t viral conditions, COVID and flu B, as rapid strep testing was negative. Strep culture sent for final confirmation.  - POCT rapid strep A - POC Covid19/Flu A&B Antigen - Culture, Group A Strep  6. Generalized body aches Patient complains of bodyaches and several associated symptoms.  -Continue symptom management.  - POC Covid19/Flu A&B Antigen  7. Acute cough Cough s/t COVID and flu B illnesses. -Continue symptom management. -Chest x-ray ordered to rule out potential complications s/t viral illnesses.  - POC Covid19/Flu A&B Antigen - DG Chest 2 View; Future  8. Fever, unspecified fever  cause Fever likely s/t COVID and flu B since rapid COVID and flu B testing were positive.  -Chest x-ray ordered to rule out complications.  - DG Chest 2 View; Future   Meds ordered this encounter  Medications   nirmatrelvir /ritonavir  (PAXLOVID ) 20 x 150 MG & 10 x 100MG  TABS    Sig: Take 3 tablets by mouth 2 (two) times daily for 5 days. (Take nirmatrelvir  150 mg two tablets twice daily for 5 days and ritonavir  100 mg one tablet twice daily for 5 days) Patient GFR is 93.    Dispense:  30 tablet    Refill:  0   albuterol  (PROVENTIL ) (2.5 MG/3ML) 0.083% nebulizer solution    Sig: Take 3 mLs (2.5 mg total) by nebulization every 4 (four) hours as needed for wheezing or shortness of breath (coughing fits).    Dispense:  75  mL    Refill:  0   albuterol  (VENTOLIN  HFA) 108 (90 Base) MCG/ACT inhaler    Sig: Inhale 2 puffs into the lungs every 6 (six) hours as needed for wheezing or shortness of breath.    Dispense:  8 g    Refill:  0    Return in about 6 weeks (around 12/20/2024) for asthma follow up with PCP. Return to office as needed if symptoms worsen.SABRA  LAYMON LOISE CORE, FNP

## 2024-11-10 LAB — CULTURE, GROUP A STREP
Micro Number: 17319454
SPECIMEN QUALITY:: ADEQUATE

## 2024-11-14 ENCOUNTER — Other Ambulatory Visit: Payer: Self-pay | Admitting: Family Medicine

## 2024-11-18 NOTE — Telephone Encounter (Signed)
 Not our patient

## 2024-11-18 NOTE — Telephone Encounter (Signed)
 Requested medication (s) are due for refill today: routing for review  Requested medication (s) are on the active medication list: no  Last refill:  06/04/24  Future visit scheduled: {Yes  Notes to clinic:  historical medication     Requested Prescriptions  Pending Prescriptions Disp Refills   SYMBICORT 160-4.5 MCG/ACT inhaler [Pharmacy Med Name: SYMBICORT 160-4.5 MCG INH 160-4.5 Aerosol] 10.2 g 6    Sig: INHALE 2 PUFFS INTO THE LUNGS TWICE A DAY     Pulmonology:  Combination Products Failed - 11/18/2024  9:43 AM      Failed - Valid encounter within last 12 months    Recent Outpatient Visits           1 week ago Shortness of breath   [facility], Laymon SAILOR, FNP   12 years ago Asthma   [facility] [place] Mario Million, MD

## 2025-06-11 ENCOUNTER — Encounter: Admitting: General Practice
